# Patient Record
Sex: Female | Born: 1985 | Race: Asian | Hispanic: No | Marital: Married | Smoking: Current every day smoker
Health system: Southern US, Community
[De-identification: ages and names within clinical notes are randomized; demographics above are authoritative.]

## PROBLEM LIST (undated history)

## (undated) DIAGNOSIS — R51 Headache: Secondary | ICD-10-CM

## (undated) DIAGNOSIS — D649 Anemia, unspecified: Secondary | ICD-10-CM

## (undated) HISTORY — DX: Headache: R51

## (undated) HISTORY — DX: Anemia, unspecified: D64.9

## (undated) HISTORY — PX: NO PAST SURGERIES: SHX2092

---

## 2003-09-10 ENCOUNTER — Ambulatory Visit (HOSPITAL_COMMUNITY): Admission: RE | Admit: 2003-09-10 | Discharge: 2003-09-10 | Payer: Self-pay | Admitting: *Deleted

## 2003-10-16 ENCOUNTER — Ambulatory Visit (HOSPITAL_COMMUNITY): Admission: RE | Admit: 2003-10-16 | Discharge: 2003-10-16 | Payer: Self-pay | Admitting: *Deleted

## 2003-10-18 ENCOUNTER — Ambulatory Visit (HOSPITAL_COMMUNITY): Admission: RE | Admit: 2003-10-18 | Discharge: 2003-10-18 | Payer: Self-pay | Admitting: *Deleted

## 2004-02-24 ENCOUNTER — Observation Stay (HOSPITAL_COMMUNITY): Admission: AD | Admit: 2004-02-24 | Discharge: 2004-02-24 | Payer: Self-pay | Admitting: *Deleted

## 2004-03-12 ENCOUNTER — Encounter: Admission: RE | Admit: 2004-03-12 | Discharge: 2004-03-12 | Payer: Self-pay | Admitting: *Deleted

## 2004-03-14 ENCOUNTER — Inpatient Hospital Stay (HOSPITAL_COMMUNITY): Admission: AD | Admit: 2004-03-14 | Discharge: 2004-03-17 | Payer: Self-pay | Admitting: *Deleted

## 2004-03-15 ENCOUNTER — Encounter (INDEPENDENT_AMBULATORY_CARE_PROVIDER_SITE_OTHER): Payer: Self-pay | Admitting: Specialist

## 2010-09-15 ENCOUNTER — Other Ambulatory Visit
Admission: RE | Admit: 2010-09-15 | Discharge: 2010-09-15 | Payer: Self-pay | Source: Home / Self Care | Admitting: Gynecology

## 2010-09-15 ENCOUNTER — Ambulatory Visit
Admission: RE | Admit: 2010-09-15 | Discharge: 2010-09-15 | Payer: Self-pay | Source: Home / Self Care | Attending: Gynecology | Admitting: Gynecology

## 2010-09-24 ENCOUNTER — Ambulatory Visit
Admission: RE | Admit: 2010-09-24 | Discharge: 2010-09-24 | Payer: Self-pay | Source: Home / Self Care | Attending: Gynecology | Admitting: Gynecology

## 2011-06-08 ENCOUNTER — Ambulatory Visit: Payer: Self-pay | Admitting: Gynecology

## 2012-04-04 ENCOUNTER — Ambulatory Visit (INDEPENDENT_AMBULATORY_CARE_PROVIDER_SITE_OTHER): Payer: Managed Care, Other (non HMO) | Admitting: Internal Medicine

## 2012-04-04 VITALS — BP 104/72 | HR 71 | Temp 98.2°F | Resp 16 | Ht <= 58 in | Wt 112.0 lb

## 2012-04-04 DIAGNOSIS — B86 Scabies: Secondary | ICD-10-CM

## 2012-04-04 MED ORDER — PERMETHRIN 5 % EX CREA: TOPICAL_CREAM | Freq: Once | CUTANEOUS | Status: AC

## 2012-04-04 MED ORDER — HYDROXYZINE HCL 25 MG PO TABS: ORAL_TABLET | ORAL | Status: DC

## 2012-04-04 NOTE — Progress Notes (Signed)
  Subjective:    Patient ID: Shari Murray, female    DOB: [birth year], 26 y.o.   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  Lesions started on wrists and spread to torso and then to buttocks and thighs   Review of SystemsNo other illnesses or medications Is a current smoker 5-10 cigarettes per day     Objective:   Physical Exam No acute distress Vital signs normal Skin= small red papules that are crusted / also smooth and are very pruritic/No vesiculation Lesions that started on wrists have resolved/lesions on the torso appear have some burrows There are multiple lesions on the inner thighs and buttocks that are new  Scraped to lesions and under the microscope could see egg casings but no distinct mites or feces       Assessment & Plan:  Impression 1 scabies  Elimite/home hygiene Followup 10 days if not well

## 2012-04-04 NOTE — Patient Instructions (Addendum)
Scabies Scabies are small bugs (mites) that burrow under the skin and cause red bumps and severe itching. These bugs can only be seen with a microscope. Scabies are highly contagious. They can spread easily from person to person by direct contact. They are also spread through sharing clothing or linens that have the scabies mites living in them. It is not unusual for an entire family to become infected through shared towels, clothing, or bedding.  HOME CARE INSTRUCTIONS   Your caregiver may prescribe a cream or lotion to kill the mites. If this cream is prescribed; massage the cream into the entire area of the body from the neck to the bottom of both feet. Also massage the cream into the scalp and face if your child is less than 38 year old. Avoid the eyes and mouth.   Leave the cream on for 8 to12 hours. Do not wash your hands after application. Your child should bathe or shower after the 8 to 12 hour application period. Sometimes it is helpful to apply the cream to your child at right before bedtime.   One treatment is usually effective and will eliminate approximately 95% of infestations. For severe cases, your caregiver may decide to repeat the treatment in 1 week. Everyone in your household should be treated with one application of the cream.   New rashes or burrows should not appear after successful treatment within 24 to 48 hours; however the itching and rash may last for 2 to 4 weeks after successful treatment. If your symptoms persist longer than this, see your caregiver.   Your caregiver also may prescribe a medication to help with the itching or to help the rash go away more quickly.   Scabies can live on clothing or linens for up to 3 days. Your entire child's recently used clothing, towels, stuffed toys, and bed linens should be washed in hot water and then dried in a dryer for at least 20 minutes on high heat. Items that cannot be washed should be enclosed in a plastic bag for at least 3  days.   To help relieve itching, bathe your child in a cool bath or apply cool washcloths to the affected areas.   Your child may return to school after treatment with the prescribed cream.  SEEK MEDICAL CARE IF:   The itching persists longer than 4 weeks after treatment.   The rash spreads or becomes infected (the area has red blisters or yellow-tan crust).  Document Released: 08/30/2005 Document Revised: 08/19/2011 Document Reviewed: 01/08/2009 [facility] Patient Information 2012 [place], [place].   Your skin scraping revealed egg sacs consistent with the diagnosis of scabies    Ellamae Sia, M.D.

## 2012-04-28 ENCOUNTER — Ambulatory Visit (INDEPENDENT_AMBULATORY_CARE_PROVIDER_SITE_OTHER): Payer: Managed Care, Other (non HMO) | Admitting: Emergency Medicine

## 2012-04-28 VITALS — BP 100/68 | HR 78 | Temp 98.5°F | Resp 20 | Ht <= 58 in | Wt 111.0 lb

## 2012-04-28 DIAGNOSIS — L309 Dermatitis, unspecified: Secondary | ICD-10-CM

## 2012-04-28 DIAGNOSIS — L259 Unspecified contact dermatitis, unspecified cause: Secondary | ICD-10-CM

## 2012-04-28 MED ORDER — IVERMECTIN 3 MG PO TABS: 3.0000 mg | ORAL_TABLET | Freq: Once | ORAL | Status: DC

## 2012-04-28 MED ORDER — DESONIDE 0.05 % EX CREA: TOPICAL_CREAM | Freq: Two times a day (BID) | CUTANEOUS | Status: DC

## 2012-04-28 MED ORDER — METHYLPREDNISOLONE ACETATE 80 MG/ML IJ SUSP
120.0000 mg | Freq: Once | INTRAMUSCULAR | Status: AC
  Administered 2012-04-28: 120 mg via INTRAMUSCULAR

## 2012-04-28 NOTE — Progress Notes (Signed)
   Date:  04/28/2012   Name:  Shari Murray   DOB:  Aug 07, 1986   MRN:  [id number] Gender: female  Age: 26 y.o.  PCP:  No primary provider on file.    Chief Complaint: Rash   History of Present Illness:  Shari Murray is a 26 y.o. pleasant patient who presents with the following:  Exposed to scabies in a patient at work and developed clinically diagnosed scabies in July.  Treated here with permethrin as was her partner.  He never developed scabies.  She has continued to have a pruritic eruption on her thighs, behind her knees and on her abdomen.  Treating that with tea tree oil and otc hydrocortisone.  Denies any contact allergens or prior skin rashes.  Is concerned that she should be considered a workers comp case.  There is no problem list on file for this patient.   No past medical history on file.  No past surgical history on file.  History  Substance Use Topics  . Smoking status: Current Everyday Smoker -- 1.0 packs/day    Types: Cigarettes  . Smokeless tobacco: Not on file  . Alcohol Use: Not on file    No family history on file.  Allergies  Allergen Reactions  . Ibuprofen Itching    Medication list has been reviewed and updated.  Current Outpatient Prescriptions on File Prior to Visit  Medication Sig Dispense Refill  . hydrOXYzine (ATARAX/VISTARIL) 25 MG tablet 1-2 tabs at bedtime for itching  14 tablet  0   No current facility-administered medications on file prior to visit.    Review of Systems:  As per HPI, otherwise negative.    Physical Examination: Filed Vitals:   04/28/12 1736  BP: 100/68  Pulse: 78  Temp: 98.5 F (36.9 C)  Resp: 20   Filed Vitals:   04/28/12 1736  Height: 4\' 9"  (1.448 m)  Weight: 111 lb (50.349 kg)   Body mass index is 24.02 kg/(m^2). Ideal Body Weight: Weight in (lb) to have BMI = 25: 115.3    GEN: WDWN, NAD, Non-toxic, Alert & Oriented x 3 HEENT: Atraumatic, Normocephalic.  Ears and Nose: No external  deformity. EXTR: No clubbing/cyanosis/edema NEURO: Normal gait.  PSYCH: Normally interactive. Conversant. Not depressed or anxious appearing.  Calm demeanor.  Skin:  Erythematous coalescent excoriated patches on the back of right knee, inner thighs and anterior thighs.  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

## 2012-09-13 NOTE — L&D Delivery Note (Signed)
Delivery Note At 10:20 AM a viable female was delivered via  (Presentation:  Left Occiput Anterior).    Placenta status: Intact, Spontaneous.  Cord:  with the following complications: loose nuchal cord.    Anesthesia: Epidural  Episiotomy: None Lacerations: Labial, left Suture Repair: 3.0 vicryl rapide Est. Blood Loss (mL): 100 ml  Mom to postpartum.  Baby to nursery-stable.  JACKSON-MOORE,Yaneli Keithley A 04/09/2013, 10:48 AM

## 2012-09-21 ENCOUNTER — Ambulatory Visit (INDEPENDENT_AMBULATORY_CARE_PROVIDER_SITE_OTHER): Payer: Managed Care, Other (non HMO) | Admitting: Gynecology

## 2012-09-21 ENCOUNTER — Encounter: Payer: Self-pay | Admitting: Gynecology

## 2012-09-21 VITALS — BP 116/70 | Ht <= 58 in | Wt 117.0 lb

## 2012-09-21 DIAGNOSIS — N912 Amenorrhea, unspecified: Secondary | ICD-10-CM

## 2012-09-21 DIAGNOSIS — Z01419 Encounter for gynecological examination (general) (routine) without abnormal findings: Secondary | ICD-10-CM

## 2012-09-21 DIAGNOSIS — Z331 Pregnant state, incidental: Secondary | ICD-10-CM

## 2012-09-21 DIAGNOSIS — Z3490 Encounter for supervision of normal pregnancy, unspecified, unspecified trimester: Secondary | ICD-10-CM | POA: Insufficient documentation

## 2012-09-21 LAB — CBC WITH DIFFERENTIAL/PLATELET
Basophils Absolute: 0.1 10*3/uL (ref 0.0–0.1)
Basophils Relative: 0 % (ref 0–1)
Eosinophils Absolute: 0.3 10*3/uL (ref 0.0–0.7)
Eosinophils Relative: 2 % (ref 0–5)
HCT: 35.4 % — ABNORMAL LOW (ref 36.0–46.0)
Hemoglobin: 11.5 g/dL — ABNORMAL LOW (ref 12.0–15.0)
Lymphocytes Relative: 14 % (ref 12–46)
Lymphs Abs: 2.4 10*3/uL (ref 0.7–4.0)
MCH: 20.4 pg — ABNORMAL LOW (ref 26.0–34.0)
MCHC: 32.5 g/dL (ref 30.0–36.0)
MCV: 62.9 fL — ABNORMAL LOW (ref 78.0–100.0)
Monocytes Absolute: 1.1 10*3/uL — ABNORMAL HIGH (ref 0.1–1.0)
Monocytes Relative: 7 % (ref 3–12)
Neutro Abs: 13.4 10*3/uL — ABNORMAL HIGH (ref 1.7–7.7)
Neutrophils Relative %: 77 % (ref 43–77)
Platelets: 333 10*3/uL (ref 150–400)
RBC: 5.63 MIL/uL — ABNORMAL HIGH (ref 3.87–5.11)
RDW: 17.8 % — ABNORMAL HIGH (ref 11.5–15.5)
WBC: 17.3 10*3/uL — ABNORMAL HIGH (ref 4.0–10.5)

## 2012-09-21 LAB — PREGNANCY, URINE: Preg Test, Ur: POSITIVE

## 2012-09-21 NOTE — Patient Instructions (Addendum)

## 2012-09-21 NOTE — Progress Notes (Signed)
Shari Murray [birth year] [id number]   History:    27 y.o.  for annual gyn exam who stated that her last menstrual period was 07/22/2012. She's not been using any form of contraception. She had a home pregnancy test which was positive last week. Urine pregnancy test here in the office today positive. Patient denied any nausea or vomiting. Patient has had normal menstrual cycles in the past. Based on last menstrual period patient currently and a half weeks gestation with a tentative due date 04/29/2012. 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 Her last Pap smear was negative on January 2012. Patient frequently does her self breast examination. Patient denied any past history of any STDs or pelvic surgery. Her last pregnancy several years ago was delivered at term and vaginally.  Past medical history,surgical history, family history and social history were all reviewed and documented in the EPIC chart.  Gynecologic History Patient's last menstrual period was 07/22/2012. Contraception: none Last Pap: 2012. Results were: normal Last mammogram: Not indicated. Results were: Not indicated  Obstetric History OB History    Grav Para Term Preterm Abortions TAB SAB Ect Mult Living   1 1 1       1      # Outc Date GA Lbr Len/2nd Wgt Sex Del Anes PTL Lv   1 TRM     M SVD  No Yes       ROS: A ROS was performed and pertinent positives and negatives are included in the history.  GENERAL: No fevers or chills. HEENT: No change in vision, no earache, sore throat or sinus congestion. NECK: No pain or stiffness. CARDIOVASCULAR: No chest pain or pressure. No palpitations. PULMONARY: No shortness of breath, cough or wheeze. GASTROINTESTINAL: No abdominal pain, nausea, vomiting or diarrhea, melena or bright red blood per rectum. GENITOURINARY: No urinary frequency, urgency,  hesitancy or dysuria. MUSCULOSKELETAL: No joint or muscle pain, no back pain, no recent trauma. DERMATOLOGIC: No rash, no itching, no lesions. ENDOCRINE: No polyuria, polydipsia, no heat or cold intolerance. No recent change in weight. HEMATOLOGICAL: No anemia or easy bruising or bleeding. NEUROLOGIC: No headache, seizures, numbness, tingling or weakness. PSYCHIATRIC: No depression, no loss of interest in normal activity or change in sleep pattern.     Exam: chaperone present  BP 116/70  Ht 4\' 9"  (1.448 m)  Wt 117 lb (53.071 kg)  BMI 25.32 kg/m2  LMP 07/22/2012  Body mass index is 25.32 kg/(m^2).  General appearance : Well developed well nourished female. No acute distress HEENT: Neck supple, trachea midline, no carotid bruits, no thyroidmegaly Lungs: Clear to auscultation, no rhonchi or wheezes, or rib retractions  Heart: Regular rate and rhythm, no murmurs or gallops Breast:Examined in sitting and supine position were symmetrical in appearance, no palpable masses or tenderness,  no skin retraction, no nipple inversion, no nipple discharge, no skin discoloration, no axillary or supraclavicular lymphadenopathy Abdomen: no palpable masses or tenderness, no rebound or guarding Extremities: no edema or skin discoloration or tenderness  Pelvic:  Bartholin, Urethra, Skene Glands: Within normal limits             Vagina: No gross lesions or discharge  Cervix: No gross lesions or discharge  Uterus 8 week size nontender             Adnexa  Without masses or tenderness  Anus and perineum  normal  Rectovaginal  normal sphincter tone without palpated masses or tenderness             Hemoccult not indicated     Assessment/Plan:  27 y.o. female for annual exam who was approximately 8-1/[redacted] weeks pregnant. A quantitative beta-hCG will be drawn today. She will return back to the office next week for an ultrasound for confirmation of viability and correct dates. She will be started on prenatal  vitamins. Literature information on pregnancy was provided. No Pap smear done today. New guidelines discussed.    Ok Edwards MD, 12:32 PM 09/21/2012

## 2012-09-22 LAB — URINALYSIS W MICROSCOPIC + REFLEX CULTURE
Bacteria, UA: NONE SEEN
Bilirubin Urine: NEGATIVE
Casts: NONE SEEN
Crystals: NONE SEEN
Glucose, UA: NEGATIVE mg/dL
Hgb urine dipstick: NEGATIVE
Ketones, ur: NEGATIVE mg/dL
Leukocytes, UA: NEGATIVE
Nitrite: NEGATIVE
Protein, ur: NEGATIVE mg/dL
Specific Gravity, Urine: 1.018 (ref 1.005–1.030)
Urobilinogen, UA: 1 mg/dL (ref 0.0–1.0)
pH: 7.5 (ref 5.0–8.0)

## 2012-09-25 ENCOUNTER — Other Ambulatory Visit: Payer: Self-pay | Admitting: Gynecology

## 2012-09-25 DIAGNOSIS — D72829 Elevated white blood cell count, unspecified: Secondary | ICD-10-CM

## 2012-09-25 DIAGNOSIS — D649 Anemia, unspecified: Secondary | ICD-10-CM

## 2012-09-29 ENCOUNTER — Ambulatory Visit (INDEPENDENT_AMBULATORY_CARE_PROVIDER_SITE_OTHER): Payer: Managed Care, Other (non HMO) | Admitting: Gynecology

## 2012-09-29 ENCOUNTER — Encounter: Payer: Self-pay | Admitting: Gynecology

## 2012-09-29 ENCOUNTER — Ambulatory Visit (INDEPENDENT_AMBULATORY_CARE_PROVIDER_SITE_OTHER): Payer: Managed Care, Other (non HMO)

## 2012-09-29 DIAGNOSIS — N912 Amenorrhea, unspecified: Secondary | ICD-10-CM

## 2012-09-29 DIAGNOSIS — D649 Anemia, unspecified: Secondary | ICD-10-CM

## 2012-09-29 DIAGNOSIS — D72829 Elevated white blood cell count, unspecified: Secondary | ICD-10-CM

## 2012-09-29 DIAGNOSIS — Z331 Pregnant state, incidental: Secondary | ICD-10-CM

## 2012-09-29 DIAGNOSIS — Z3490 Encounter for supervision of normal pregnancy, unspecified, unspecified trimester: Secondary | ICD-10-CM

## 2012-09-29 LAB — US OB TRANSVAGINAL

## 2012-09-29 LAB — CBC WITH DIFFERENTIAL/PLATELET
Basophils Absolute: 0.1 10*3/uL (ref 0.0–0.1)
Basophils Relative: 0 % (ref 0–1)
Eosinophils Absolute: 0.4 10*3/uL (ref 0.0–0.7)
Eosinophils Relative: 2 % (ref 0–5)
HCT: 32.2 % — ABNORMAL LOW (ref 36.0–46.0)
Hemoglobin: 10.4 g/dL — ABNORMAL LOW (ref 12.0–15.0)
Lymphocytes Relative: 19 % (ref 12–46)
Lymphs Abs: 3.6 10*3/uL (ref 0.7–4.0)
MCH: 20.2 pg — ABNORMAL LOW (ref 26.0–34.0)
MCHC: 32.3 g/dL (ref 30.0–36.0)
MCV: 62.5 fL — ABNORMAL LOW (ref 78.0–100.0)
Monocytes Absolute: 1.1 10*3/uL — ABNORMAL HIGH (ref 0.1–1.0)
Monocytes Relative: 6 % (ref 3–12)
Neutro Abs: 13.5 10*3/uL — ABNORMAL HIGH (ref 1.7–7.7)
Neutrophils Relative %: 73 % (ref 43–77)
Platelets: 315 10*3/uL (ref 150–400)
RBC: 5.15 MIL/uL — ABNORMAL HIGH (ref 3.87–5.11)
RDW: 17.1 % — ABNORMAL HIGH (ref 11.5–15.5)
WBC: 18.7 10*3/uL — ABNORMAL HIGH (ref 4.0–10.5)

## 2012-09-29 NOTE — Progress Notes (Signed)
Patient is a 27 year old now gravida 1 para 0 who has stated her last menstrual period was 07/22/2012. She had not been using any form of contraception. She had a positive home pregnancy test and confirmed in the office on office visit on 09/21/2012. Patient reports normal menstrual cycles prior to that. She presented to the office today for an ultrasound to confirm fetal viability since based on her last menstrual period she is currently 9 weeks and 6 days with an estimated date of confinement 04/28/2013. Patient denies any past history of sexually transmitted diseases or any pelvic surgery and has otherwise been healthy.  Ultrasound today demonstrated an intrauterine gestational sac as well as a yolk sac was identified. A fetal pole was seen with cardiac activity noted. Both ovaries appeared to be normal and there was no fluid in the cul-de-sac.  On patient's last visit her CBC demonstrated a white count of 17.3  with a hemoglobin 11.5 hematocrit 35.4 and a normal platelet count 333,000. She 7 CBC repeated today and did informed me that she had some form of tooth problem last week.  Patient will be which referred to my colleagues at [facility] for her obstetrical care. She was reminded to take her prenatal vitamins.

## 2012-09-30 LAB — HCG, QUANTITATIVE, PREGNANCY: hCG, Beta Chain, Quant, S: 100325.4 m[IU]/mL

## 2012-10-13 ENCOUNTER — Telehealth: Payer: Self-pay

## 2012-10-13 NOTE — Telephone Encounter (Signed)
We had faxed labs to them and patient had called to schedule appointment.  Patient has a big deductible and could not afford to come for appointment. Was going to speak with husband about it and also some discussion regarding applying for Medicaid.  Patient has not called them back to schedule OB appointment and they just wanted to make you aware.

## 2012-10-16 ENCOUNTER — Telehealth: Payer: Self-pay

## 2012-10-16 NOTE — Telephone Encounter (Signed)
Patient called. She was able to get Medicaid.  She has Social worker but Medicaid secondary to pick up balance. She said [facility] does not accept Medicaid patients so she has not been able to schedule appointment with them.  She was calling to see if I knew any practice that accepted Medicare for OB pts.  I told her we are not provided that info and my suggestion would be to call around town to the different groups and inquire.  I did provide her with the GYN Clinic number at [facility] in case she cannot find a group and hopefully they can direct her to the [facility] clinic at [facility] or have other ideas. (I did not have a phone number for OB clinic.)  She will call back when she gets appointment set so we can fax records to her OB MD.

## 2012-11-01 LAB — OB RESULTS CONSOLE GC/CHLAMYDIA
Chlamydia: NEGATIVE
Gonorrhea: NEGATIVE

## 2012-11-29 ENCOUNTER — Other Ambulatory Visit: Payer: Self-pay | Admitting: Obstetrics

## 2012-11-29 DIAGNOSIS — Z369 Encounter for antenatal screening, unspecified: Secondary | ICD-10-CM

## 2012-12-10 ENCOUNTER — Encounter: Payer: Self-pay | Admitting: *Deleted

## 2012-12-13 ENCOUNTER — Other Ambulatory Visit: Payer: Managed Care, Other (non HMO)

## 2012-12-13 ENCOUNTER — Ambulatory Visit (INDEPENDENT_AMBULATORY_CARE_PROVIDER_SITE_OTHER): Payer: Managed Care, Other (non HMO)

## 2012-12-13 ENCOUNTER — Ambulatory Visit (INDEPENDENT_AMBULATORY_CARE_PROVIDER_SITE_OTHER): Payer: Managed Care, Other (non HMO) | Admitting: Obstetrics

## 2012-12-13 ENCOUNTER — Encounter: Payer: Managed Care, Other (non HMO) | Admitting: Obstetrics & Gynecology

## 2012-12-13 VITALS — BP 109/78 | Temp 98.0°F | Wt 113.0 lb

## 2012-12-13 DIAGNOSIS — Z3482 Encounter for supervision of other normal pregnancy, second trimester: Secondary | ICD-10-CM

## 2012-12-13 DIAGNOSIS — Z369 Encounter for antenatal screening, unspecified: Secondary | ICD-10-CM

## 2012-12-13 DIAGNOSIS — Z3480 Encounter for supervision of other normal pregnancy, unspecified trimester: Secondary | ICD-10-CM

## 2012-12-13 LAB — POCT URINALYSIS DIPSTICK
Bilirubin, UA: NEGATIVE
Blood, UA: NEGATIVE
Glucose, UA: NEGATIVE
Ketones, UA: NEGATIVE
Leukocytes, UA: NEGATIVE
Nitrite, UA: NEGATIVE
Protein, UA: NEGATIVE
Spec Grav, UA: <=1.005
Urobilinogen, UA: NEGATIVE
pH, UA: 8

## 2012-12-13 LAB — US OB DETAIL + 14 WK

## 2012-12-13 NOTE — Progress Notes (Signed)
Pulse- 87 Pt states that she has a lot of burning and irritation with discharge after intercourse.

## 2012-12-13 NOTE — Progress Notes (Signed)
No complaints. Doing well.

## 2012-12-19 ENCOUNTER — Encounter: Payer: Self-pay | Admitting: Obstetrics

## 2012-12-25 ENCOUNTER — Other Ambulatory Visit: Payer: Self-pay | Admitting: Obstetrics

## 2012-12-29 ENCOUNTER — Ambulatory Visit (HOSPITAL_COMMUNITY): Payer: Managed Care, Other (non HMO) | Attending: Obstetrics

## 2013-01-11 ENCOUNTER — Ambulatory Visit (INDEPENDENT_AMBULATORY_CARE_PROVIDER_SITE_OTHER): Payer: Managed Care, Other (non HMO) | Admitting: Obstetrics

## 2013-01-11 ENCOUNTER — Other Ambulatory Visit: Payer: Managed Care, Other (non HMO)

## 2013-01-11 ENCOUNTER — Encounter: Payer: Self-pay | Admitting: Obstetrics

## 2013-01-11 VITALS — BP 103/67 | Temp 98.7°F | Wt 118.0 lb

## 2013-01-11 DIAGNOSIS — Z3482 Encounter for supervision of other normal pregnancy, second trimester: Secondary | ICD-10-CM

## 2013-01-11 DIAGNOSIS — N760 Acute vaginitis: Secondary | ICD-10-CM | POA: Insufficient documentation

## 2013-01-11 DIAGNOSIS — Z3480 Encounter for supervision of other normal pregnancy, unspecified trimester: Secondary | ICD-10-CM

## 2013-01-11 LAB — POCT URINALYSIS DIPSTICK
Blood, UA: NEGATIVE
Glucose, UA: NEGATIVE
Ketones, UA: NEGATIVE
Nitrite, UA: NEGATIVE
Spec Grav, UA: <=1.005
pH, UA: 8

## 2013-01-11 MED ORDER — FLUCONAZOLE 150 MG PO TABS: 150.0000 mg | ORAL_TABLET | Freq: Once | ORAL | Status: DC

## 2013-01-11 NOTE — Addendum Note (Signed)
Addended by: Julaine Hua on: 01/11/2013 05:35 PM   Modules accepted: Orders

## 2013-01-11 NOTE — Progress Notes (Signed)
Pulse-98 Pt c/o vaginal burning and itching after sex four weeks. Pt c/o thick white vaginal discharge. Pt denies odor.

## 2013-01-12 LAB — OBSTETRIC PANEL
Antibody Screen: NEGATIVE
Basophils Absolute: 0.1 10*3/uL (ref 0.0–0.1)
Basophils Relative: 0 % (ref 0–1)
Eosinophils Absolute: 0.4 10*3/uL (ref 0.0–0.7)
Eosinophils Relative: 2 % (ref 0–5)
HCT: 30.4 % — ABNORMAL LOW (ref 36.0–46.0)
Hemoglobin: 9.6 g/dL — ABNORMAL LOW (ref 12.0–15.0)
Hepatitis B Surface Ag: NEGATIVE
Lymphocytes Relative: 13 % (ref 12–46)
Lymphs Abs: 2.6 10*3/uL (ref 0.7–4.0)
MCH: 20.3 pg — ABNORMAL LOW (ref 26.0–34.0)
MCHC: 31.6 g/dL (ref 30.0–36.0)
MCV: 64.4 fL — ABNORMAL LOW (ref 78.0–100.0)
Monocytes Absolute: 1 10*3/uL (ref 0.1–1.0)
Monocytes Relative: 5 % (ref 3–12)
Neutro Abs: 16.2 10*3/uL — ABNORMAL HIGH (ref 1.7–7.7)
Neutrophils Relative %: 80 % — ABNORMAL HIGH (ref 43–77)
Platelets: 348 10*3/uL (ref 150–400)
RBC: 4.72 MIL/uL (ref 3.87–5.11)
RDW: 19.2 % — ABNORMAL HIGH (ref 11.5–15.5)
Rh Type: POSITIVE
Rubella: 0.77 Index (ref <0.90)
WBC: 20.3 10*3/uL — ABNORMAL HIGH (ref 4.0–10.5)

## 2013-01-12 LAB — HIV ANTIBODY (ROUTINE TESTING W REFLEX): HIV: NONREACTIVE

## 2013-01-12 LAB — GLUCOSE TOLERANCE, 2 HOURS W/ 1HR
Glucose, 1 hour: 103 mg/dL (ref 70–170)
Glucose, 2 hour: 84 mg/dL (ref 70–139)
Glucose, Fasting: 39 mg/dL — CL (ref 70–99)

## 2013-01-12 LAB — WET PREP BY MOLECULAR PROBE
Candida species: POSITIVE — AB
Gardnerella vaginalis: POSITIVE — AB
Trichomonas vaginosis: NEGATIVE

## 2013-01-12 LAB — VITAMIN D 25 HYDROXY (VIT D DEFICIENCY, FRACTURES): Vit D, 25-Hydroxy: 14 ng/mL — ABNORMAL LOW (ref 30–89)

## 2013-01-12 LAB — VARICELLA ZOSTER ANTIBODY, IGG: Varicella IgG: 619.9 Index — ABNORMAL HIGH (ref <135.00)

## 2013-01-13 LAB — CULTURE, OB URINE
Colony Count: NO GROWTH
Organism ID, Bacteria: NO GROWTH

## 2013-01-15 LAB — HEMOGLOBINOPATHY EVALUATION
Hemoglobin Other: 93.1 % — ABNORMAL HIGH
Hgb A2 Quant: 4.7 % — ABNORMAL HIGH (ref 2.2–3.2)
Hgb A: 0 % — ABNORMAL LOW (ref 96.8–97.8)
Hgb F Quant: 2.2 % — ABNORMAL HIGH (ref 0.0–2.0)
Hgb S Quant: 0 %

## 2013-01-16 ENCOUNTER — Other Ambulatory Visit: Payer: Self-pay | Admitting: *Deleted

## 2013-01-16 DIAGNOSIS — B9689 Other specified bacterial agents as the cause of diseases classified elsewhere: Secondary | ICD-10-CM

## 2013-01-16 DIAGNOSIS — B373 Candidiasis of vulva and vagina: Secondary | ICD-10-CM

## 2013-01-16 DIAGNOSIS — N760 Acute vaginitis: Secondary | ICD-10-CM

## 2013-01-16 MED ORDER — FLUCONAZOLE 150 MG PO TABS: ORAL_TABLET | ORAL | Status: DC

## 2013-01-16 MED ORDER — TINIDAZOLE 500 MG PO TABS: 1.0000 g | ORAL_TABLET | Freq: Every day | ORAL | Status: AC

## 2013-01-16 NOTE — Progress Notes (Signed)
Pt aware of results and prescription sent to pt's pharmacy. Pt expressed understanding.   

## 2013-01-19 LAB — HGB ELECTROPHORESIS REFLEXED REPORT
Hemoglobin A - HGBRFX: 0 % — ABNORMAL LOW (ref >96.0)
Hemoglobin F - HGBRFX: 1.8 % (ref <2.0)

## 2013-01-23 ENCOUNTER — Other Ambulatory Visit: Payer: Self-pay | Admitting: Obstetrics

## 2013-01-24 ENCOUNTER — Encounter: Payer: Self-pay | Admitting: Obstetrics

## 2013-01-25 ENCOUNTER — Telehealth: Payer: Self-pay | Admitting: Hematology & Oncology

## 2013-01-25 NOTE — Telephone Encounter (Signed)
Left pt message to call and schedule appointment °

## 2013-01-26 ENCOUNTER — Telehealth: Payer: Self-pay | Admitting: Hematology & Oncology

## 2013-01-26 NOTE — Telephone Encounter (Signed)
Left message on both home and cell for pt to call and schedule appointment

## 2013-01-30 ENCOUNTER — Ambulatory Visit (INDEPENDENT_AMBULATORY_CARE_PROVIDER_SITE_OTHER): Payer: Managed Care, Other (non HMO) | Admitting: Obstetrics

## 2013-01-30 ENCOUNTER — Telehealth: Payer: Self-pay | Admitting: Hematology & Oncology

## 2013-01-30 VITALS — BP 110/72 | Temp 98.2°F | Wt 119.2 lb

## 2013-01-30 DIAGNOSIS — Z3480 Encounter for supervision of other normal pregnancy, unspecified trimester: Secondary | ICD-10-CM

## 2013-01-30 DIAGNOSIS — Z3483 Encounter for supervision of other normal pregnancy, third trimester: Secondary | ICD-10-CM

## 2013-01-30 DIAGNOSIS — Z3482 Encounter for supervision of other normal pregnancy, second trimester: Secondary | ICD-10-CM

## 2013-01-30 LAB — POCT URINALYSIS DIPSTICK
Bilirubin, UA: NEGATIVE
Glucose, UA: NEGATIVE
Ketones, UA: NEGATIVE
Leukocytes, UA: NEGATIVE
Protein, UA: NEGATIVE
Spec Grav, UA: <=1.005

## 2013-01-30 NOTE — Progress Notes (Signed)
Pulse: 102

## 2013-01-30 NOTE — Telephone Encounter (Signed)
Scarlette Calico from referring called said pt was coming in today at 3 pm and wanted to give her the appointment time. She is aware of 6-6 appointment and that pt may have to wait 45 minutes between lab and MD appointment

## 2013-01-31 ENCOUNTER — Encounter: Payer: Self-pay | Admitting: Obstetrics

## 2013-02-02 ENCOUNTER — Ambulatory Visit (HOSPITAL_COMMUNITY)
Admission: RE | Admit: 2013-02-02 | Discharge: 2013-02-02 | Disposition: A | Discharge status: Home / Self Care | Payer: Managed Care, Other (non HMO) | Source: Ambulatory Visit | Attending: Obstetrics | Admitting: Obstetrics

## 2013-02-08 NOTE — Progress Notes (Signed)
Genetic Counseling  High-Risk Gestation Note  Appointment Date:  02/02/2013 Referred By: Brock Bad, MD Date of Birth:  1986/04/14    Pregnancy History: G2P1001 Estimated Date of Delivery: 04/28/13 Estimated Gestational Age: [redacted]w[redacted]d Attending: Eulis Foster, MD   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 We reviewed both family histories, and the couple's son reportedly has hemoglobin E trait. Additionally, the patient reported that her sister and a niece also have hemoglobin E trait. [name] reported that her husband has Caucasian ancestry and no known family history of hemoglobinopathies or hemoglobin variants.  The family histories are noncontributory for birth defects, mental retardation, and known genetic conditions. Without further information regarding the provided family history, an accurate genetic risk cannot be calculated. Further genetic counseling is warranted if more information is obtained.  We discussed that hemoglobin is the oxygen-carrying pigment of red blood cells.  The type of hemoglobin we have is determined by inheritance.  There are different genes that make hemoglobin and its subunits; examples include alpha globin genes and beta globin genes. Hemoglobin E and beta thalassemia are caused due to changes in the beta globin gene. It is not uncommon for a mistake to exist in a hemoglobin gene which may produce a hemoglobin variant. We reviewed the autosomal recessive inheritance of hemoglobinopathies. A pregnancy would have a 1 in 4 (25%) chance to inherit a hemoglobinopathy, such as thalassemia, when both parents are carriers of a  hemoglobin variant affecting the same hemoglobin chain (such as both parents carrying beta globin variants). All offspring of an individual who is homozygous for hemoglobin variants would be obligate carriers.   We specifically discussed with the patient that hemoglobin EE disease is generally benign. However, hemoglobin E inherited with beta thalassemia trait may have clinical symptoms similar to beta thalassemia major, associated with severe anemia and splenomegaly. [name] reported that she has had anemia throughout her lifetime but has not had major medical issues or symptoms of thalassemia that she is aware of. [name] hemoglobin electrophoresis reported a Hb F level of 1.8%. For individuals with Hemoglobin E/Beta thalassemia, HbF is usually in excess of 5%.  We discussed that based on available information, she is more likely to be homozygous for hemoglobin E. However, this cannot be determined definitively at this time. [name] has an upcoming appointment with hematology to further clarify her status and discuss medical management for herself.   We reviewed the autosomal recessive inheritance of hemoglobinopathies and discussed that the risk to inherit a hemoglobinopathy for the pregnancy would depend upon the carrier status of the father of the pregnancy. We reviewed that all of Shari Murray offspring will be obligate carriers of hemoglobin E (if she is Hemoglobin EE), and if she is hemoglobin E/beta thalassemia, each offspring would carry either Hb E or beta thalassemia. We reviewed that the carrier frequency of hemoglobin variants and thalassemia is lower in the Caucasian population. If the father of the pregnancy carries a hemoglobin variant, risk for inheritance of a hemoglobinopathy for the pregnancy would be 50%. If he does not carry a hemoglobin variant, then all of their offspring together would be carriers for a hemoglobin variant but not affected.  We  reviewed that hemoglobin E is a beta globin variant. However, there are reports of hemoglobin E (beta globin variant) trait and co-inheritance of alpha thalassemia traits. Individuals who are heterozygous for Hb E and for alpha-thalassemia trait of one deletion (-a/aa) or two deletions (--/aa) reportedly have normal clinical features. Whereas co-inheritance of Hb E and Hemoglobin H (--/-a) results in intermediate thalassemia called HbAE Bart's disease. Thus, carrier screening for the father of the pregnancy, if desired, would be most informative if performed for beta and alpha globin chain variants. We discussed the screening is available for the father of the pregnancy via hemoglobin electrophoresis, complete blood count, and ferritin studies, if desired. The patient may contact our office if they would like [place] to facilitate this screening.   We discussed that prenatal ultrasound would not be expected to detect features of a hemoglobinopathy in most cases, aside from hemoglobin Barts disease, for which this pregnancy is not expected to be at risk. Amniocentesis would be available for an at risk pregnancy for prenatal diagnosis of hemoglobinopathy. The risks, benefits, and limitations of amniocentesis were reviewed. Shari Murray indicated that she would not be interested in amniocentesis, even if the pregnancy were determined to be at risk for a hemoglobinopathy. We discussed that hemoglobinopathies are routinely screened for as part of the [place] newborn screening panel.    [name] denied exposure to environmental toxins or chemical agents. She denied the use of alcohol, tobacco or street drugs. She denied significant viral illnesses during the course of her pregnancy. Her medical and surgical histories were otherwise noncontributory.   I counseled [name] regarding the above risks and available options.  The approximate face-to-face time with the genetic counselor was 35  minutes.  Quinn Plowman, MS,  Certified Genetic Counselor 02/08/2013

## 2013-02-13 ENCOUNTER — Ambulatory Visit (INDEPENDENT_AMBULATORY_CARE_PROVIDER_SITE_OTHER): Payer: Managed Care, Other (non HMO) | Admitting: Obstetrics

## 2013-02-13 VITALS — BP 96/61 | Temp 97.9°F | Wt 119.0 lb

## 2013-02-13 DIAGNOSIS — Z3480 Encounter for supervision of other normal pregnancy, unspecified trimester: Secondary | ICD-10-CM

## 2013-02-13 LAB — POCT URINALYSIS DIPSTICK
Bilirubin, UA: NEGATIVE
Glucose, UA: NEGATIVE
Nitrite, UA: NEGATIVE
pH, UA: 7

## 2013-02-13 NOTE — Progress Notes (Signed)
Pulse-95 No complaints. 

## 2013-02-14 ENCOUNTER — Encounter: Payer: Self-pay | Admitting: Obstetrics

## 2013-02-16 ENCOUNTER — Other Ambulatory Visit (HOSPITAL_BASED_OUTPATIENT_CLINIC_OR_DEPARTMENT_OTHER): Payer: BC Managed Care – PPO | Admitting: Lab

## 2013-02-16 ENCOUNTER — Ambulatory Visit (HOSPITAL_BASED_OUTPATIENT_CLINIC_OR_DEPARTMENT_OTHER): Payer: BC Managed Care – PPO | Admitting: Hematology & Oncology

## 2013-02-16 ENCOUNTER — Ambulatory Visit: Payer: Managed Care, Other (non HMO)

## 2013-02-16 VITALS — BP 167/83 | HR 91 | Temp 98.1°F | Resp 16 | Ht <= 58 in | Wt 121.0 lb

## 2013-02-16 DIAGNOSIS — D565 Hemoglobin E-beta thalassemia: Secondary | ICD-10-CM

## 2013-02-16 DIAGNOSIS — F17200 Nicotine dependence, unspecified, uncomplicated: Secondary | ICD-10-CM

## 2013-02-16 DIAGNOSIS — D72829 Elevated white blood cell count, unspecified: Secondary | ICD-10-CM

## 2013-02-16 LAB — CBC WITH DIFFERENTIAL (CANCER CENTER ONLY)
BASO#: 0.1 10*3/uL (ref 0.0–0.2)
BASO%: 0.2 % (ref 0.0–2.0)
EOS%: 1.1 % (ref 0.0–7.0)
HGB: 9.8 g/dL — ABNORMAL LOW (ref 11.6–15.9)
LYMPH#: 3.3 10*3/uL (ref 0.9–3.3)
MCHC: 34.6 g/dL (ref 32.0–36.0)
MONO#: 2.1 10*3/uL — ABNORMAL HIGH (ref 0.1–0.9)
NEUT#: 17 10*3/uL — ABNORMAL HIGH (ref 1.5–6.5)
WBC: 22.7 10*3/uL — ABNORMAL HIGH (ref 3.9–10.0)

## 2013-02-16 MED ORDER — FOLIC ACID 1 MG PO TABS: 1.0000 mg | ORAL_TABLET | Freq: Every day | ORAL | Status: DC

## 2013-02-16 NOTE — Progress Notes (Signed)
This office note has been dictated.

## 2013-02-17 NOTE — Progress Notes (Signed)
CC:   Shari Murray, M.D.  DIAGNOSES: 1. Hemoglobin E. 2. Third-trimester pregnancy.  HISTORY OF PRESENT ILLNESS:  Shari Murray is a very charming 27 year old [place] woman.  She and her family have been in the [place] for about 19 years.  She is married for the past couple of years.  She does have a 54-year-old son.  She had no problems with this pregnancy.  She is now followed by Dr. Coral Ceo.  He started seeing her during the pregnancy.  She is going to be due on 04/28/2013.  He did do some lab work on her.  He did do a hemoglobin electrophoresis on her.  This showed that she had a hemoglobin E.  There was a tiny amount of hemoglobin A2 (4.5%).  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  He felt that a hematologic evaluation was indicated because of the abnormal hemoglobin.  She has been checked for hepatitis.  This has been negative.  She has been checked for HIV.  This has been negative.  Lab work going back to, I think, January showed a hemoglobin 11.5, hematocrit 35.1.  MCV was 63.  Her platelet count was 333,000.  In early May, a CBC was done which showed a white cell count of 20.3, hemoglobin 9.6, hematocrit 30.4, platelet count 348,000.  MCV was 64.  Shari Murray does smoke.  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  Also noted was that she had a marked hypoglycemia when tested back in May.  She says she probably smoked about a pack per day before her pregnancy. She is still smoking a few cigarettes a day.  She is on prenatal vitamins right now.  Again, she has never had any transfusions.  She has never had any type of issues with "anemia."  There has been no bony pain.  She has had no hematuria.  There has been no cough or shortness of breath.  The patient says she does feel tired, which she said she gets from her pregnancy.  PAST MEDICAL HISTORY:  Pretty much  unremarkable.  ALLERGIES:  Ibuprofen and shellfish.  MEDICATIONS: 1. Prenatal vitamins daily. 2. Tylenol daily.  SOCIAL HISTORY:  Remarkable for tobacco use.  She states that she does have 1-pack-per-day use.  She is going to try to cut out smoking with her pregnancy and after she delivers.  There is no significant alcohol use.  She has no obvious occupational exposures.  FAMILY HISTORY:  Remarkable for anemia.  REVIEW OF SYSTEMS:  As stated in History of Present Illness.  No additional findings are noted on 12-system review.  PHYSICAL EXAMINATION:  General:  This is a very petite [place] female in no obvious distress.  Vital Signs:  Show a temperature of 98.1, pulse 61, respiratory rate 16, blood pressure 96/59.  Weight is 121.  Head and neck:  Normocephalic, atraumatic skull.  There are no ocular or oral lesions.  She has no scleral icterus.  She does have some residual tonsillar tissue.  There is no adenopathy in her neck.  Thyroid is nonpalpable.  Lungs:  Clear bilaterally.  Cardiac:  Regular rate and rhythm, with a normal S1 and S2.  There are no murmurs, rubs or bruits. Abdomen:  Soft, with good bowel sounds.  There is no palpable abdominal mass.  There is no fluid wave.  There is no palpable hepatomegaly.  She is obviously pregnant.  I cannot feel her spleen  tip.  Extremities: Show no clubbing, cyanosis or edema.  She has good range motion of her joints.  Skin:  No rashes, ecchymoses or petechia.  Neurological:  Shows no focal neurological deficit.  LABORATORY STUDIES:  White cell count is 22.7, hemoglobin 9.8, hematocrit 28.3, platelet count is 324,000.  MCV is 62.  White cell differential shows 75 segs, 15 lymphs, 9 monos.  Peripheral smear shows a microcytic population of red blood cells.  She has marked target cells.  I do not see any inclusion bodies.  There may be a rare schistocyte.  There are a couple of spherocytes.  There are no nucleated red blood cells.   I see no rouleaux formation.  White cells are increased in number.  White cells appear reactive.  There are no blasts.  There are no atypical lymphocytes or immature myeloid cells. There are no hypersegmented polys.  Platelets are adequate in number and size.  IMPRESSION:  Shari Murray is a 27 year old [place] woman with hemoglobin E.  This is quite common in [place].  Probably about 25-30% of the population has hemoglobin E.  For the most part, hemoglobin E a benign disorder.  This has been proven to be the case so far with Shari Murray.  She got through her first pregnancy without any difficulties.  Her baby, when he was born, was 8 pounds.  This pregnancy she is having a girl.  So far, everything is going along okay.  I think Shari Murray biggest problem is going to be smoking.  I think this would be the biggest reason for her to have any issues with her blood.  With smoking, the carboxyhemoglobin that is formed can denature her hemoglobin E and could lead to hemolysis.  I do believe that she needs to be on folic acid.  I realize that there is some folic acid in her prenatal vitamins.  I believe that she needs to be on a higher dose of folic acid.  I gave her a prescription for 1 mg a day of folic acid.  Since she had no problems with her 1st pregnancy, I could not imagine her having a problem with this pregnancy.  I am not sure as to why she has the leukocytosis.  Her blood smear is unremarkable.  It could be reactive.  It could reflect some kind of underlying infection.  However, I do not see that the leukocytosis is reflective of anything with respect to a bone marrow issue or her hemoglobinopathy.  I had a great time talking with Shari Murray and her husband.  They are both a very cute couple.  I want to see her back in about 2 months.  I want to make sure that she is doing okay before she delivers and that her hemoglobin is optimized as much as  possible.    ______________________________ Josph Macho, M.D. PRE/MEDQ  D:  02/16/2013  T:  02/17/2013  Job:  [id number]

## 2013-02-19 ENCOUNTER — Telehealth: Payer: Self-pay | Admitting: Hematology & Oncology

## 2013-02-19 NOTE — Telephone Encounter (Signed)
Mailed 7-30 schedule

## 2013-02-20 LAB — HEMOGLOBINOPATHY EVALUATION
Hgb A2 Quant: 4.6 % — ABNORMAL HIGH (ref 2.2–3.2)
Hgb A: 0 % — ABNORMAL LOW (ref 96.8–97.8)
Hgb F Quant: 1.8 % (ref 0.0–2.0)
Hgb S Quant: 0 %

## 2013-02-20 LAB — IRON AND TIBC: %SAT: 14 % — ABNORMAL LOW (ref 20–55)

## 2013-02-20 LAB — FERRITIN: Ferritin: 22 ng/mL (ref 10–291)

## 2013-02-26 ENCOUNTER — Telehealth: Payer: Self-pay | Admitting: Hematology & Oncology

## 2013-02-26 NOTE — Telephone Encounter (Addendum)
Message copied by Cathi Roan on Mon Feb 26, 2013  2:34 PM ------      Message from: Arlan Organ R      Created: Mon Feb 26, 2013  7:32 AM       Call - iron is low.  Need to take some OTC iron supplement in addition to pre-natal vitamins.  Cindee Lame  02-26-13 Called patient at home and left detailed message on machine regarding above MD message, advised if any questions to call this office. S.Trynosky LPN ------

## 2013-02-27 ENCOUNTER — Encounter: Payer: Self-pay | Admitting: Obstetrics

## 2013-02-27 ENCOUNTER — Ambulatory Visit (INDEPENDENT_AMBULATORY_CARE_PROVIDER_SITE_OTHER): Payer: BC Managed Care – PPO | Admitting: Obstetrics

## 2013-02-27 VITALS — BP 100/63 | Temp 98.0°F | Wt 124.0 lb

## 2013-02-27 DIAGNOSIS — Z3483 Encounter for supervision of other normal pregnancy, third trimester: Secondary | ICD-10-CM

## 2013-02-27 DIAGNOSIS — Z3480 Encounter for supervision of other normal pregnancy, unspecified trimester: Secondary | ICD-10-CM

## 2013-02-27 LAB — POCT URINALYSIS DIPSTICK
Blood, UA: NEGATIVE
Ketones, UA: NEGATIVE
Protein, UA: NEGATIVE
Spec Grav, UA: 1.01
Urobilinogen, UA: NEGATIVE
pH, UA: 7

## 2013-02-27 NOTE — Progress Notes (Signed)
Pulse- 101  Pt states she is having a thick discharge. Pt is having trouble holding her urine.

## 2013-03-13 ENCOUNTER — Encounter: Payer: Self-pay | Admitting: Obstetrics

## 2013-03-13 ENCOUNTER — Ambulatory Visit (INDEPENDENT_AMBULATORY_CARE_PROVIDER_SITE_OTHER): Payer: BC Managed Care – PPO | Admitting: Obstetrics

## 2013-03-13 ENCOUNTER — Ambulatory Visit (INDEPENDENT_AMBULATORY_CARE_PROVIDER_SITE_OTHER): Payer: BC Managed Care – PPO

## 2013-03-13 VITALS — BP 98/66 | Temp 98.5°F | Wt 124.0 lb

## 2013-03-13 DIAGNOSIS — O365933 Maternal care for other known or suspected poor fetal growth, third trimester, fetus 3: Secondary | ICD-10-CM

## 2013-03-13 DIAGNOSIS — Z3480 Encounter for supervision of other normal pregnancy, unspecified trimester: Secondary | ICD-10-CM

## 2013-03-13 DIAGNOSIS — O365990 Maternal care for other known or suspected poor fetal growth, unspecified trimester, not applicable or unspecified: Secondary | ICD-10-CM

## 2013-03-13 DIAGNOSIS — Z3483 Encounter for supervision of other normal pregnancy, third trimester: Secondary | ICD-10-CM

## 2013-03-13 LAB — US OB DETAIL + 14 WK

## 2013-03-13 LAB — POCT URINALYSIS DIPSTICK
Bilirubin, UA: NEGATIVE
Blood, UA: NEGATIVE
Ketones, UA: NEGATIVE
Spec Grav, UA: 1.015
pH, UA: 7

## 2013-03-13 NOTE — Progress Notes (Signed)
Pulse 92, patient has no concerns today

## 2013-03-15 ENCOUNTER — Encounter: Payer: Self-pay | Admitting: Obstetrics

## 2013-03-15 LAB — US OB DETAIL + 14 WK

## 2013-03-26 ENCOUNTER — Encounter: Payer: BC Managed Care – PPO | Admitting: Obstetrics

## 2013-03-26 ENCOUNTER — Encounter: Payer: Self-pay | Admitting: Obstetrics

## 2013-03-26 ENCOUNTER — Ambulatory Visit (INDEPENDENT_AMBULATORY_CARE_PROVIDER_SITE_OTHER): Payer: BC Managed Care – PPO | Admitting: Obstetrics

## 2013-03-26 VITALS — BP 110/66 | Temp 98.5°F | Wt 126.0 lb

## 2013-03-26 DIAGNOSIS — Z3483 Encounter for supervision of other normal pregnancy, third trimester: Secondary | ICD-10-CM

## 2013-03-26 DIAGNOSIS — Z3480 Encounter for supervision of other normal pregnancy, unspecified trimester: Secondary | ICD-10-CM

## 2013-03-26 LAB — POCT URINALYSIS DIPSTICK
Blood, UA: NEGATIVE
Glucose, UA: NEGATIVE
Nitrite, UA: NEGATIVE
Spec Grav, UA: 1.015
Urobilinogen, UA: NEGATIVE

## 2013-03-26 NOTE — Progress Notes (Signed)
Pulse-96 No complaints.

## 2013-03-28 LAB — STREP B DNA PROBE: GBSP: NEGATIVE

## 2013-03-29 ENCOUNTER — Encounter: Payer: Self-pay | Admitting: Obstetrics & Gynecology

## 2013-03-29 LAB — US OB DETAIL + 14 WK

## 2013-04-02 ENCOUNTER — Ambulatory Visit (INDEPENDENT_AMBULATORY_CARE_PROVIDER_SITE_OTHER): Payer: BC Managed Care – PPO | Admitting: Obstetrics

## 2013-04-02 VITALS — BP 111/76 | Temp 98.1°F | Wt 127.6 lb

## 2013-04-02 DIAGNOSIS — Z3483 Encounter for supervision of other normal pregnancy, third trimester: Secondary | ICD-10-CM

## 2013-04-02 DIAGNOSIS — Z3480 Encounter for supervision of other normal pregnancy, unspecified trimester: Secondary | ICD-10-CM

## 2013-04-02 LAB — POCT URINALYSIS DIPSTICK
Blood, UA: NEGATIVE
Glucose, UA: NEGATIVE
Ketones, UA: NEGATIVE
Spec Grav, UA: <=1.005

## 2013-04-02 NOTE — Progress Notes (Signed)
Pulse: 84

## 2013-04-03 ENCOUNTER — Encounter: Payer: Self-pay | Admitting: Obstetrics

## 2013-04-05 ENCOUNTER — Encounter: Payer: Self-pay | Admitting: Obstetrics

## 2013-04-08 ENCOUNTER — Inpatient Hospital Stay (HOSPITAL_COMMUNITY)
Admission: AD | Admit: 2013-04-08 | Discharge: 2013-04-11 | DRG: 373 | Disposition: A | Discharge status: Home / Self Care | Payer: BC Managed Care – PPO | Source: Ambulatory Visit | Attending: Obstetrics | Admitting: Obstetrics

## 2013-04-08 ENCOUNTER — Encounter (HOSPITAL_COMMUNITY): Payer: Self-pay | Admitting: *Deleted

## 2013-04-08 LAB — CBC
HCT: 28 % — ABNORMAL LOW (ref 36.0–46.0)
Hemoglobin: 9.6 g/dL — ABNORMAL LOW (ref 12.0–15.0)
MCV: 61.5 fL — ABNORMAL LOW (ref 78.0–100.0)
RDW: 17.3 % — ABNORMAL HIGH (ref 11.5–15.5)
WBC: 21.4 10*3/uL — ABNORMAL HIGH (ref 4.0–10.5)

## 2013-04-08 LAB — TYPE AND SCREEN: Antibody Screen: NEGATIVE

## 2013-04-08 MED ORDER — OXYTOCIN 40 UNITS IN LACTATED RINGERS INFUSION - SIMPLE MED: 62.5000 mL/h | INTRAVENOUS | Status: DC

## 2013-04-08 MED ORDER — OXYCODONE-ACETAMINOPHEN 5-325 MG PO TABS: 1.0000 | ORAL_TABLET | ORAL | Status: DC | PRN

## 2013-04-08 MED ORDER — OXYTOCIN 40 UNITS IN LACTATED RINGERS INFUSION - SIMPLE MED
1.0000 m[IU]/min | INTRAVENOUS | Status: DC
  Administered 2013-04-08: 2 m[IU]/min via INTRAVENOUS
  Filled 2013-04-08: qty 1000

## 2013-04-08 MED ORDER — BUTORPHANOL TARTRATE 1 MG/ML IJ SOLN
1.0000 mg | INTRAMUSCULAR | Status: DC | PRN
  Administered 2013-04-08: 1 mg via INTRAVENOUS

## 2013-04-08 MED ORDER — LACTATED RINGERS IV SOLN: 500.0000 mL | Freq: Once | INTRAVENOUS | Status: DC

## 2013-04-08 MED ORDER — FLEET ENEMA 7-19 GM/118ML RE ENEM: 1.0000 | ENEMA | RECTAL | Status: DC | PRN

## 2013-04-08 MED ORDER — OXYTOCIN BOLUS FROM INFUSION
500.0000 mL | INTRAVENOUS | Status: DC
  Administered 2013-04-09: 500 mL via INTRAVENOUS

## 2013-04-08 MED ORDER — ACETAMINOPHEN 325 MG PO TABS: 650.0000 mg | ORAL_TABLET | ORAL | Status: DC | PRN

## 2013-04-08 MED ORDER — LACTATED RINGERS IV SOLN: 500.0000 mL | INTRAVENOUS | Status: DC | PRN

## 2013-04-08 MED ORDER — PHENYLEPHRINE 40 MCG/ML (10ML) SYRINGE FOR IV PUSH (FOR BLOOD PRESSURE SUPPORT): 80.0000 ug | PREFILLED_SYRINGE | INTRAVENOUS | Status: DC | PRN

## 2013-04-08 MED ORDER — EPHEDRINE 5 MG/ML INJ
10.0000 mg | INTRAVENOUS | Status: DC | PRN
  Filled 2013-04-08: qty 4

## 2013-04-08 MED ORDER — LACTATED RINGERS IV SOLN
INTRAVENOUS | Status: DC
  Administered 2013-04-08 – 2013-04-09 (×3): via INTRAVENOUS

## 2013-04-08 MED ORDER — TERBUTALINE SULFATE 1 MG/ML IJ SOLN: 0.2500 mg | Freq: Once | INTRAMUSCULAR | Status: AC | PRN

## 2013-04-08 MED ORDER — CITRIC ACID-SODIUM CITRATE 334-500 MG/5ML PO SOLN: 30.0000 mL | ORAL | Status: DC | PRN

## 2013-04-08 MED ORDER — LIDOCAINE HCL (PF) 1 % IJ SOLN
30.0000 mL | INTRAMUSCULAR | Status: AC | PRN
  Administered 2013-04-09: 30 mL via SUBCUTANEOUS
  Filled 2013-04-08 (×2): qty 30

## 2013-04-08 MED ORDER — FENTANYL 2.5 MCG/ML BUPIVACAINE 1/10 % EPIDURAL INFUSION (WH - ANES)
14.0000 mL/h | INTRAMUSCULAR | Status: DC | PRN
  Administered 2013-04-09 (×2): 14 mL/h via EPIDURAL
  Filled 2013-04-08: qty 125

## 2013-04-08 MED ORDER — EPHEDRINE 5 MG/ML INJ: 10.0000 mg | INTRAVENOUS | Status: DC | PRN

## 2013-04-08 MED ORDER — PHENYLEPHRINE 40 MCG/ML (10ML) SYRINGE FOR IV PUSH (FOR BLOOD PRESSURE SUPPORT)
80.0000 ug | PREFILLED_SYRINGE | INTRAVENOUS | Status: DC | PRN
  Filled 2013-04-08: qty 5

## 2013-04-08 MED ORDER — ONDANSETRON HCL 4 MG/2ML IJ SOLN: 4.0000 mg | Freq: Four times a day (QID) | INTRAMUSCULAR | Status: DC | PRN

## 2013-04-08 MED ORDER — DIPHENHYDRAMINE HCL 50 MG/ML IJ SOLN: 12.5000 mg | INTRAMUSCULAR | Status: DC | PRN

## 2013-04-08 MED ORDER — BUTORPHANOL TARTRATE 1 MG/ML IJ SOLN
1.0000 mg | INTRAMUSCULAR | Status: DC | PRN
  Filled 2013-04-08: qty 1

## 2013-04-08 NOTE — MAU Note (Signed)
Pt states contdrations are closer and harder

## 2013-04-09 ENCOUNTER — Encounter: Payer: BC Managed Care – PPO | Admitting: Obstetrics

## 2013-04-09 ENCOUNTER — Encounter (HOSPITAL_COMMUNITY): Payer: Self-pay | Admitting: *Deleted

## 2013-04-09 ENCOUNTER — Inpatient Hospital Stay (HOSPITAL_COMMUNITY): Payer: BC Managed Care – PPO | Admitting: Anesthesiology

## 2013-04-09 ENCOUNTER — Encounter (HOSPITAL_COMMUNITY): Payer: Self-pay | Admitting: Anesthesiology

## 2013-04-09 MED ORDER — LANOLIN HYDROUS EX OINT: TOPICAL_OINTMENT | CUTANEOUS | Status: DC | PRN

## 2013-04-09 MED ORDER — FERROUS SULFATE 325 (65 FE) MG PO TABS
325.0000 mg | ORAL_TABLET | Freq: Two times a day (BID) | ORAL | Status: DC
  Administered 2013-04-09 – 2013-04-11 (×4): 325 mg via ORAL
  Filled 2013-04-09 (×4): qty 1

## 2013-04-09 MED ORDER — DIPHENHYDRAMINE HCL 25 MG PO CAPS: 25.0000 mg | ORAL_CAPSULE | Freq: Four times a day (QID) | ORAL | Status: DC | PRN

## 2013-04-09 MED ORDER — BENZOCAINE-MENTHOL 20-0.5 % EX AERO
1.0000 "application " | INHALATION_SPRAY | CUTANEOUS | Status: DC | PRN
  Filled 2013-04-09: qty 56

## 2013-04-09 MED ORDER — ACETAMINOPHEN 500 MG PO TABS
1000.0000 mg | ORAL_TABLET | Freq: Four times a day (QID) | ORAL | Status: DC | PRN
  Administered 2013-04-09 – 2013-04-11 (×4): 1000 mg via ORAL
  Filled 2013-04-09 (×4): qty 2

## 2013-04-09 MED ORDER — OXYCODONE-ACETAMINOPHEN 5-325 MG PO TABS: 1.0000 | ORAL_TABLET | ORAL | Status: DC | PRN

## 2013-04-09 MED ORDER — ONDANSETRON HCL 4 MG PO TABS: 4.0000 mg | ORAL_TABLET | ORAL | Status: DC | PRN

## 2013-04-09 MED ORDER — LIDOCAINE HCL (PF) 1 % IJ SOLN
INTRAMUSCULAR | Status: DC | PRN
  Administered 2013-04-09 (×2): 5 mL

## 2013-04-09 MED ORDER — ZOLPIDEM TARTRATE 5 MG PO TABS: 5.0000 mg | ORAL_TABLET | Freq: Every evening | ORAL | Status: DC | PRN

## 2013-04-09 MED ORDER — DIBUCAINE 1 % RE OINT: 1.0000 "application " | TOPICAL_OINTMENT | RECTAL | Status: DC | PRN

## 2013-04-09 MED ORDER — WITCH HAZEL-GLYCERIN EX PADS: 1.0000 "application " | MEDICATED_PAD | CUTANEOUS | Status: DC | PRN

## 2013-04-09 MED ORDER — SENNOSIDES-DOCUSATE SODIUM 8.6-50 MG PO TABS
2.0000 | ORAL_TABLET | Freq: Every day | ORAL | Status: DC
  Administered 2013-04-09 – 2013-04-10 (×2): 2 via ORAL

## 2013-04-09 MED ORDER — PRENATAL MULTIVITAMIN CH
1.0000 | ORAL_TABLET | Freq: Every day | ORAL | Status: DC
  Administered 2013-04-10: 1 via ORAL
  Filled 2013-04-09 (×2): qty 1

## 2013-04-09 MED ORDER — TRAMADOL HCL 50 MG PO TABS
100.0000 mg | ORAL_TABLET | Freq: Four times a day (QID) | ORAL | Status: DC | PRN
  Administered 2013-04-09 – 2013-04-11 (×4): 100 mg via ORAL
  Filled 2013-04-09: qty 2
  Filled 2013-04-09 (×2): qty 1
  Filled 2013-04-09 (×2): qty 2

## 2013-04-09 MED ORDER — MEASLES, MUMPS & RUBELLA VAC ~~LOC~~ INJ
0.5000 mL | INJECTION | Freq: Once | SUBCUTANEOUS | Status: AC
  Administered 2013-04-11: 0.5 mL via SUBCUTANEOUS
  Filled 2013-04-09 (×2): qty 0.5

## 2013-04-09 MED ORDER — ONDANSETRON HCL 4 MG/2ML IJ SOLN: 4.0000 mg | INTRAMUSCULAR | Status: DC | PRN

## 2013-04-09 MED ORDER — NICOTINE 14 MG/24HR TD PT24
14.0000 mg | MEDICATED_PATCH | Freq: Every day | TRANSDERMAL | Status: DC
  Filled 2013-04-09 (×3): qty 1

## 2013-04-09 MED ORDER — TETANUS-DIPHTH-ACELL PERTUSSIS 5-2.5-18.5 LF-MCG/0.5 IM SUSP: 0.5000 mL | Freq: Once | INTRAMUSCULAR | Status: DC

## 2013-04-09 MED ORDER — IBUPROFEN 600 MG PO TABS: 600.0000 mg | ORAL_TABLET | Freq: Four times a day (QID) | ORAL | Status: DC

## 2013-04-09 MED ORDER — MAGNESIUM HYDROXIDE 400 MG/5ML PO SUSP: 30.0000 mL | ORAL | Status: DC | PRN

## 2013-04-09 NOTE — Progress Notes (Signed)
This note also relates to the following rows which could not be included: Dose (milli-units/min) Oxytocin - Cannot attach notes to extension rows Order received from Dr. Gaynell Face to turn off pitocin. Informed that he will be in the OR for several hours and unavailable if needed for delivery.

## 2013-04-09 NOTE — H&P (Signed)
This is Dr. Francoise Ceo dictating the history and physical on  [place] [facility] she's a 27 year old gravida 2 para 101 at 37 weeks and 2 days EDC 04/28/2013 GBS negative patient's membranes ruptured yesterday morning fluid clear and eventually transferred to labor and delivery she's now on Pitocin and her cervix is 5 cm vertex -2 Past medical history negative Past surgical history negative Social history noncontributory System review negative Physical exam well-developed female in labor HEENT negative Lungs clear to P&A Heart regular rhythm no murmurs no gallops Breasts negative Abdomen term Pelvic as described above Extremities negative and

## 2013-04-09 NOTE — Anesthesia Postprocedure Evaluation (Signed)
  Anesthesia Post-op Note  Patient: Shari Murray  Procedure(s) Performed: * No procedures listed *  Patient Location: Mother/Baby  Anesthesia Type:Epidural  Level of Consciousness: awake, alert  and oriented  Airway and Oxygen Therapy: Patient Spontanous Breathing  Post-op Pain: mild  Post-op Assessment: Post-op Vital signs reviewed, Patient's Cardiovascular Status Stable, No headache, No backache, No residual numbness and No residual motor weakness  Post-op Vital Signs: Reviewed and stable  Complications: No apparent anesthesia complications

## 2013-04-09 NOTE — Anesthesia Procedure Notes (Signed)
Epidural Patient location during procedure: OB Start time: 04/09/2013 1:53 AM  Staffing Anesthesiologist: Angus Seller., Harrell Gave. Performed by: anesthesiologist   Preanesthetic Checklist Completed: patient identified, site marked, surgical consent, pre-op evaluation, timeout performed, IV checked, risks and benefits discussed and monitors and equipment checked  Epidural Patient position: sitting Prep: ChloraPrep and site prepped and draped Patient monitoring: continuous pulse ox and blood pressure Approach: midline Injection technique: LOR air and LOR saline  Needle:  Needle type: Tuohy  Needle gauge: 17 G Needle length: 9 cm and 9 Needle insertion depth: 5 cm cm Catheter type: closed end flexible Catheter size: 19 Gauge Catheter at skin depth: 10 cm Test dose: negative  Assessment Events: blood not aspirated, injection not painful, no injection resistance, negative IV test and no paresthesia  Additional Notes Patient identified.  Risk benefits discussed including failed block, incomplete pain control, headache, nerve damage, paralysis, blood pressure changes, nausea, vomiting, reactions to medication both toxic or allergic, and postpartum back pain.  Patient expressed understanding and wished to proceed.  All questions were answered.  Sterile technique used throughout procedure and epidural site dressed with sterile barrier dressing. No paresthesia or other complications noted.The patient did not experience any signs of intravascular injection such as tinnitus or metallic taste in mouth nor signs of intrathecal spread such as rapid motor block. Please see nursing notes for vital signs.

## 2013-04-09 NOTE — Anesthesia Preprocedure Evaluation (Signed)
Anesthesia Evaluation  Patient identified by MRN, date of birth, ID band Patient awake    Reviewed: Allergy & Precautions, H&P , Patient's Chart, lab work & pertinent test results  Airway Mallampati: II TM Distance: >3 FB Neck ROM: full    Dental no notable dental hx.    Pulmonary neg pulmonary ROS,  breath sounds clear to auscultation  Pulmonary exam normal       Cardiovascular negative cardio ROS  Rhythm:regular Rate:Normal     Neuro/Psych negative neurological ROS  negative psych ROS   GI/Hepatic negative GI ROS, Neg liver ROS,   Endo/Other  negative endocrine ROS  Renal/GU negative Renal ROS     Musculoskeletal   Abdominal   Peds  Hematology negative hematology ROS (+) anemia ,   Anesthesia Other Findings   Reproductive/Obstetrics (+) Pregnancy                           Anesthesia Physical Anesthesia Plan  ASA: II  Anesthesia Plan: Epidural   Post-op Pain Management:    Induction:   Airway Management Planned:   Additional Equipment:   Intra-op Plan:   Post-operative Plan:   Informed Consent: I have reviewed the patients History and Physical, chart, labs and discussed the procedure including the risks, benefits and alternatives for the proposed anesthesia with the patient or authorized representative who has indicated his/her understanding and acceptance.     Plan Discussed with:   Anesthesia Plan Comments:         Anesthesia Quick Evaluation

## 2013-04-10 LAB — CBC
HCT: 27.8 % — ABNORMAL LOW (ref 36.0–46.0)
MCH: 21.3 pg — ABNORMAL LOW (ref 26.0–34.0)
MCHC: 34.5 g/dL (ref 30.0–36.0)
MCV: 61.6 fL — ABNORMAL LOW (ref 78.0–100.0)
RDW: 17.2 % — ABNORMAL HIGH (ref 11.5–15.5)
WBC: 28.2 10*3/uL — ABNORMAL HIGH (ref 4.0–10.5)

## 2013-04-10 NOTE — Progress Notes (Signed)
Post Partum Day 1 Subjective: no complaints  Objective: Blood pressure 107/70, pulse 71, temperature 97.7 F (36.5 C), temperature source Oral, resp. rate 18, height 4\' 9"  (1.448 m), weight 127 lb (57.607 kg), last menstrual period 07/22/2012, SpO2 97.00%, unknown if currently breastfeeding.  Physical Exam:  General: alert and no distress Lochia: appropriate Uterine Fundus: firm Incision: none DVT Evaluation: No evidence of DVT seen on physical exam.   Recent Labs  04/08/13 0810 04/10/13 0550  HGB 9.6* 9.6*  HCT 28.0* 27.8*    Assessment/Plan: Plan for discharge tomorrow   LOS: 2 days   Shari Murray A 04/10/2013, 9:53 AM

## 2013-04-11 MED ORDER — TRAMADOL HCL 50 MG PO TABS: 100.0000 mg | ORAL_TABLET | Freq: Four times a day (QID) | ORAL | Status: DC | PRN

## 2013-04-11 NOTE — Progress Notes (Signed)
Post Partum Day 2 Subjective: no complaints  Objective: Blood pressure 106/71, pulse 74, temperature 98.3 F (36.8 C), temperature source Oral, resp. rate 18, height 4\' 9"  (1.448 m), weight 127 lb (57.607 kg), last menstrual period 07/22/2012, SpO2 97.00%, unknown if currently breastfeeding.  Physical Exam:  General: alert and no distress Lochia: appropriate Uterine Fundus: firm Incision: none DVT Evaluation: No evidence of DVT seen on physical exam.   Recent Labs  04/08/13 0810 04/10/13 0550  HGB 9.6* 9.6*  HCT 28.0* 27.8*    Assessment/Plan: Discharge home   LOS: 3 days   HARPER,CHARLES A 04/11/2013, 7:52 AM

## 2013-04-11 NOTE — Discharge Summary (Signed)
Obstetric Discharge Summary Reason for Admission: onset of labor Prenatal Procedures: ultrasound Intrapartum Procedures: spontaneous vaginal delivery Postpartum Procedures: none Complications-Operative and Postpartum: none HGB  Date Value Range Status  02/16/2013 9.8* 11.6 - 15.9 g/dL Final     Hemoglobin  Date Value Range Status  04/10/2013 9.6* 12.0 - 15.0 g/dL Final     HCT  Date Value Range Status  04/10/2013 27.8* 36.0 - 46.0 % Final  02/16/2013 28.3* 34.8 - 46.6 % Final    Physical Exam:  General: alert and no distress Lochia: appropriate Uterine Fundus: firm Incision: none DVT Evaluation: No evidence of DVT seen on physical exam.  Discharge Diagnoses: Term Pregnancy-delivered  Discharge Information: Date: 04/11/2013 Activity: pelvic rest Diet: routine Medications: PNV, Ibuprofen, Colace, Iron and Percocet Condition: stable Instructions: refer to practice specific booklet Discharge to: home Follow-up Information   Follow up with HARPER,CHARLES A, MD. Schedule an appointment as soon as possible for a visit in 2 weeks.   Contact information:   [address] [place] [address] [phone]       Newborn Data: Live born female  Birth Weight: 5 lb 8.7 oz (2515 g) APGAR: 8, 9  Home with mother.  HARPER,CHARLES A 04/11/2013, 8:01 AM

## 2013-04-12 ENCOUNTER — Ambulatory Visit: Payer: BC Managed Care – PPO | Admitting: Hematology & Oncology

## 2013-04-12 ENCOUNTER — Telehealth: Payer: Self-pay | Admitting: Hematology & Oncology

## 2013-04-12 ENCOUNTER — Other Ambulatory Visit: Payer: BC Managed Care – PPO | Admitting: Lab

## 2013-04-12 NOTE — Telephone Encounter (Signed)
Pt had baby cx 7-31. Per RN Amy pt may need to be seen once more she will talk to MD and let me know if I need to schedule

## 2013-07-03 ENCOUNTER — Ambulatory Visit (INDEPENDENT_AMBULATORY_CARE_PROVIDER_SITE_OTHER): Payer: BC Managed Care – PPO | Admitting: Obstetrics

## 2013-07-03 DIAGNOSIS — Z3009 Encounter for other general counseling and advice on contraception: Secondary | ICD-10-CM

## 2013-07-03 MED ORDER — NORETHINDRONE 0.35 MG PO TABS: 1.0000 | ORAL_TABLET | Freq: Every day | ORAL | Status: DC

## 2013-07-03 NOTE — Progress Notes (Signed)
Subjective:     Shari Murray is a 27 y.o. female who presents for a postpartum visit. She is 2 months postpartum following a spontaneous vaginal delivery. I have fully reviewed the prenatal and intrapartum course. The delivery was at 37.2 gestational weeks. Outcome: spontaneous vaginal delivery. Anesthesia: epidural. Postpartum course has been normal. Baby's course has been normal. Baby is feeding by breast. Bleeding no bleeding. Bowel function is normal. Bladder function is normal. Patient is sexually active. Contraception method is condoms. Postpartum depression screening: negative.  The following portions of the patient's history were reviewed and updated as appropriate: allergies, current medications, past family history, past medical history, past social history, past surgical history and problem list.  Review of Systems Pertinent items are noted in HPI.   Objective:    BP 104/69  Pulse 86  Temp(Src) 98.9 F (37.2 C)  Wt 106 lb (48.081 kg)  BMI 22.93 kg/m2  Breastfeeding? Yes  General:  alert and no distressL   Breasts:  inspection negative, no nipple discharge or bleeding, no masses or nodularity palpable  PE;    Breasts:  No masses or tenderness. Abdomen:  Soft, NT Uterus:  NSSC, NT.                                 Assessment:     Normal postpartum exam. Pap smear not done at today's visit.    Counseling done for contraceptive initiation.  Breast feeding.   Plan:    1. Contraception: oral progesterone-only contraceptive   2. All questions answered. 3. Follow up in: several months or as needed.

## 2014-01-14 ENCOUNTER — Ambulatory Visit: Payer: Self-pay | Admitting: Family Medicine

## 2014-01-14 VITALS — BP 126/76 | HR 96 | Temp 100.3°F | Resp 16 | Ht <= 58 in | Wt 115.3 lb

## 2014-01-14 DIAGNOSIS — R509 Fever, unspecified: Secondary | ICD-10-CM

## 2014-01-14 DIAGNOSIS — D72829 Elevated white blood cell count, unspecified: Secondary | ICD-10-CM

## 2014-01-14 DIAGNOSIS — J029 Acute pharyngitis, unspecified: Secondary | ICD-10-CM

## 2014-01-14 LAB — POCT URINALYSIS DIPSTICK
Bilirubin, UA: NEGATIVE
Blood, UA: NEGATIVE
Glucose, UA: NEGATIVE
Ketones, UA: NEGATIVE
Leukocytes, UA: NEGATIVE
Nitrite, UA: NEGATIVE
Protein, UA: NEGATIVE
Spec Grav, UA: 1.015
Urobilinogen, UA: 1
pH, UA: 7.5

## 2014-01-14 LAB — POCT CBC
GRANULOCYTE PERCENT: 86.2 % — AB (ref 37–80)
HEMATOCRIT: 35.5 % — AB (ref 37.7–47.9)
HEMOGLOBIN: 11.8 g/dL — AB (ref 12.2–16.2)
LYMPH, POC: 2 (ref 0.6–3.4)
MCH, POC: 20.5 pg — AB (ref 27–31.2)
MCHC: 33.2 g/dL (ref 31.8–35.4)
MCV: 61.6 fL — AB (ref 80–97)
MID (cbc): 1 — AB (ref 0–0.9)
MPV: 10.2 fL (ref 0–99.8)
POC Granulocyte: 19.1 — AB (ref 2–6.9)
POC LYMPH %: 9.2 % — AB (ref 10–50)
POC MID %: 4.6 %M (ref 0–12)
Platelet Count, POC: 325 10*3/uL (ref 142–424)
RBC: 5.77 M/uL — AB (ref 4.04–5.48)
RDW, POC: 16 %
WBC: 22.1 10*3/uL — AB (ref 4.6–10.2)

## 2014-01-14 LAB — POCT RAPID STREP A (OFFICE): RAPID STREP A SCREEN: NEGATIVE

## 2014-01-14 LAB — POCT UA - MICROSCOPIC ONLY
Bacteria, U Microscopic: NEGATIVE
Casts, Ur, LPF, POC: NEGATIVE
Crystals, Ur, HPF, POC: NEGATIVE
Mucus, UA: NEGATIVE
RBC, urine, microscopic: NEGATIVE
Yeast, UA: NEGATIVE

## 2014-01-14 MED ORDER — ACETAMINOPHEN 325 MG PO TABS: 1000.0000 mg | ORAL_TABLET | Freq: Once | ORAL | Status: DC

## 2014-01-14 MED ORDER — PENICILLIN V POTASSIUM 500 MG PO TABS: 500.0000 mg | ORAL_TABLET | Freq: Two times a day (BID) | ORAL | Status: DC

## 2014-01-14 NOTE — Patient Instructions (Signed)
Use the penicillin antibiotic as directed.  Let me know if you do not feel better in the next 24- 36 hours- Sooner if worse.   Please come by for a lab visit only in about 1 week to recheck your blood count.

## 2014-01-14 NOTE — Progress Notes (Addendum)
[facility] [address] [place] [address] [phone]  Date:  01/14/2014   Name:  Shari Murray   DOB:  1986-03-02   MRN:  [id number]  PCP:  Shelly Bombard, MD    Chief Complaint: Generalized Body Aches and Sore Throat   History of Present Illness:  Shari Murray is a 28 y.o. very pleasant female patient who presents with the following:  She is here today with 3 days of fever, ST and swollen glands.   Her infant daughter is ill as well- she is 18 months old and is on amoxicillin for OM. She is acutally not lactating right now- she has weaned her baby.   She has noted just an occasional cough.   No earache. No GI symptoms.      LMP was 4/22  She is generally healthy She took some advil this am but no tylenol as of yet  Patient Active Problem List   Diagnosis Date Noted  . Normal delivery 04/09/2013  . Poor fetal growth, affecting management of mother, antepartum condition or complication 45/36/4680  . Vaginitis and vulvovaginitis, unspecified 01/11/2013  . Pregnancy 09/21/2012    Past Medical History  Diagnosis Date  . Anemia   . HOZYYQMG(500.3)     Past Surgical History  Procedure Laterality Date  . No past surgeries      History  Substance Use Topics  . Smoking status: Current Every Day Smoker -- 0.50 packs/day    Types: Cigarettes    Start date: 12/10/1993  . Smokeless tobacco: Never Used  . Alcohol Use: No    Family History  Problem Relation Age of Onset  . Hypertension Father     Allergies  Allergen Reactions  . Ibuprofen Itching  . Shellfish Allergy Hives and Itching    Medication list has been reviewed and updated.  Current Outpatient Prescriptions on File Prior to Visit  Medication Sig Dispense Refill  . folic acid (FOLVITE) 1 MG tablet Take 1 tablet (1 mg total) by mouth daily.  30 tablet  12  . norethindrone (MICRONOR,CAMILA,ERRIN) 0.35 MG tablet Take 1 tablet (0.35 mg total) by mouth daily.  1  Package  11  . Prenatal Vit-Fe Fumarate-FA (PRENATAL MULTIVITAMIN) TABS Take 1 tablet by mouth daily at 12 noon.      . traMADol (ULTRAM) 50 MG tablet Take 2 tablets (100 mg total) by mouth every 6 (six) hours as needed.  40 tablet  2   No current facility-administered medications on file prior to visit.    Review of Systems:  As per HPI- otherwise negative.   Physical Examination: Filed Vitals:   01/14/14 1313  BP: 126/76  Pulse: 122  Temp: 103 F (39.4 C)  Resp: 16   Filed Vitals:   01/14/14 1313  Height: 4' 9.5" (1.461 m)  Weight: 115 lb 5.1 oz (52.309 kg)   Body mass index is 24.51 kg/(m^2). Ideal Body Weight: Weight in (lb) to have BMI = 25: 117.3  GEN: WDWN, NAD, Non-toxic, A & O x 3, looks well HEENT: Atraumatic, Normocephalic. Neck supple. No masses, No LAD.  Bilateral TM wnl, oropharynx shows mild exudate.  PEERL,EOMI.   Ears and Nose: No external deformity. CV: RRR, No M/G/R. No JVD. No thrill. No extra heart sounds. PULM: CTA B, no wheezes, crackles, rhonchi. No retractions. No resp. distress. No accessory muscle use. ABD: S, NT, ND, +BS. No rebound. No HSM. EXTR: No c/c/e NEURO Normal gait.  PSYCH:  Normally interactive. Conversant. Not depressed or anxious appearing.  Calm demeanor.   Given tylenol ES #2 in clinic.    Results for orders placed in visit on 01/14/14  POCT RAPID STREP A (OFFICE)      Result Value Ref Range   Rapid Strep A Screen Negative  Negative  POCT CBC      Result Value Ref Range   WBC 22.1 (*) 4.6 - 10.2 K/uL   Lymph, poc 2.0  0.6 - 3.4   POC LYMPH PERCENT 9.2 (*) 10 - 50 %L   MID (cbc) 1.0 (*) 0 - 0.9   POC MID % 4.6  0 - 12 %M   POC Granulocyte 19.1 (*) 2 - 6.9   Granulocyte percent 86.2 (*) 37 - 80 %G   RBC 5.77 (*) 4.04 - 5.48 M/uL   Hemoglobin 11.8 (*) 12.2 - 16.2 g/dL   HCT, POC 35.5 (*) 37.7 - 47.9 %   MCV 61.6 (*) 80 - 97 fL   MCH, POC 20.5 (*) 27 - 31.2 pg   MCHC 33.2  31.8 - 35.4 g/dL   RDW, POC 16.0     Platelet  Count, POC 325  142 - 424 K/uL   MPV 10.2  0 - 99.8 fL  POCT UA - MICROSCOPIC ONLY      Result Value Ref Range   WBC, Ur, HPF, POC 0-1     RBC, urine, microscopic neg     Bacteria, U Microscopic neg     Mucus, UA neg     Epithelial cells, urine per micros 0-6     Crystals, Ur, HPF, POC neg     Casts, Ur, LPF, POC neg     Yeast, UA neg    POCT URINALYSIS DIPSTICK      Result Value Ref Range   Color, UA yellow     Clarity, UA clear     Glucose, UA neg     Bilirubin, UA neg     Ketones, UA neg     Spec Grav, UA 1.015     Blood, UA neg     pH, UA 7.5     Protein, UA neg     Urobilinogen, UA 1.0     Nitrite, UA neg     Leukocytes, UA Negative      Assessment and Plan: Fever, unspecified - Plan: acetaminophen (TYLENOL) tablet 975 mg, POCT rapid strep A, Culture, Group A Strep, POCT CBC, POCT UA - Microscopic Only, POCT urinalysis dipstick, penicillin v potassium (VEETID) 500 MG tablet  Acute pharyngitis - Plan: POCT rapid strep A, Culture, Group A Strep, penicillin v potassium (VEETID) 500 MG tablet  Leukocytosis, unspecified - Plan: CBC treat for febrile pharyngitis with penicillin.  Suspect she may have strep.  Await throat culture.  Continue antipyretics as needed.   Repeat CBC in one week See patient instructions for more details.     Meds ordered this encounter  Medications  . acetaminophen (TYLENOL) tablet 975 mg    Sig:   . penicillin v potassium (VEETID) 500 MG tablet    Sig: Take 1 tablet (500 mg total) by mouth 2 (two) times daily.    Dispense:  20 tablet    Refill:  0   Signed Lamar Blinks, MD  5/7: called and LMOM. I received throat culture, she has strep non- group A strep per throat culture.  Let me know if not better, and remember to repeat CBC in one week

## 2014-01-17 LAB — CULTURE, GROUP A STREP

## 2014-07-31 ENCOUNTER — Ambulatory Visit (INDEPENDENT_AMBULATORY_CARE_PROVIDER_SITE_OTHER): Payer: 59 | Admitting: Emergency Medicine

## 2014-07-31 VITALS — BP 112/70 | HR 78 | Temp 98.1°F | Resp 16 | Ht <= 58 in | Wt 122.0 lb

## 2014-07-31 DIAGNOSIS — J209 Acute bronchitis, unspecified: Secondary | ICD-10-CM

## 2014-07-31 DIAGNOSIS — J0100 Acute maxillary sinusitis, unspecified: Secondary | ICD-10-CM

## 2014-07-31 MED ORDER — AMOXICILLIN-POT CLAVULANATE 875-125 MG PO TABS: 1.0000 | ORAL_TABLET | Freq: Two times a day (BID) | ORAL | Status: DC

## 2014-07-31 MED ORDER — PROMETHAZINE-CODEINE 6.25-10 MG/5ML PO SYRP: 5.0000 mL | ORAL_SOLUTION | ORAL | Status: DC | PRN

## 2014-07-31 MED ORDER — PSEUDOEPHEDRINE-GUAIFENESIN ER 60-600 MG PO TB12: 1.0000 | ORAL_TABLET | Freq: Two times a day (BID) | ORAL | Status: AC

## 2014-07-31 NOTE — Progress Notes (Signed)
[facility] [address] [place] [address] [phone]  Date:  07/31/2014   Name:  Shari Murray   DOB:  1986/05/02   MRN:  [id number]  PCP:  Shelly Bombard, MD    Chief Complaint: Cough   History of Present Illness:  Shari Murray is a 28 y.o. very pleasant female patient who presents with the following:  Ill since Thursday with cough and congestion.  Nasal congestion with a purulent nasal drainage and post nasal drip.   Has a cough productive scant sputum.  No wheezing or shortness of breath.  Cough worse at night.   No fever but chilled No nausea or vomiting or stool change.  No rash Eating normally No improvement with over the counter medications or other home remedies.  Denies other complaint or health concern today.   Patient Active Problem List   Diagnosis Date Noted  . Normal delivery 04/09/2013  . Poor fetal growth, affecting management of mother, antepartum condition or complication 19/50/9326  . Vaginitis and vulvovaginitis, unspecified 01/11/2013  . Pregnancy 09/21/2012    Past Medical History  Diagnosis Date  . Anemia   . ZTIWPYKD(983.3)     Past Surgical History  Procedure Laterality Date  . No past surgeries      History  Substance Use Topics  . Smoking status: Current Every Day Smoker -- 0.50 packs/day    Types: Cigarettes    Start date: 12/10/1993  . Smokeless tobacco: Never Used  . Alcohol Use: No    Family History  Problem Relation Age of Onset  . Hypertension Father     Allergies  Allergen Reactions  . Ibuprofen Itching    Able to take advil brand ok  . Shellfish Allergy Hives and Itching    Medication list has been reviewed and updated.  Current Outpatient Prescriptions on File Prior to Visit  Medication Sig Dispense Refill  . folic acid (FOLVITE) 1 MG tablet Take 1 tablet (1 mg total) by mouth daily. 30 tablet 12  . norethindrone (MICRONOR,CAMILA,ERRIN) 0.35 MG tablet Take 1 tablet  (0.35 mg total) by mouth daily. 1 Package 11  . penicillin v potassium (VEETID) 500 MG tablet Take 1 tablet (500 mg total) by mouth 2 (two) times daily. 20 tablet 0  . Prenatal Vit-Fe Fumarate-FA (PRENATAL MULTIVITAMIN) TABS Take 1 tablet by mouth daily at 12 noon.    . traMADol (ULTRAM) 50 MG tablet Take 2 tablets (100 mg total) by mouth every 6 (six) hours as needed. 40 tablet 2   Current Facility-Administered Medications on File Prior to Visit  Medication Dose Route Frequency Provider Last Rate Last Dose  . acetaminophen (TYLENOL) tablet 975 mg  975 mg Oral Once Darreld Mclean, MD        Review of Systems:   As per HPI, otherwise negative.    Physical Examination: Filed Vitals:   07/31/14 1443  BP: 112/70  Pulse: 78  Temp: 98.1 F (36.7 C)  Resp: 16   Filed Vitals:   07/31/14 1443  Height: 4' 9.5" (1.461 m)  Weight: 122 lb (55.339 kg)   Body mass index is 25.93 kg/(m^2). Ideal Body Weight: Weight in (lb) to have BMI = 25: 117.3  GEN: WDWN, NAD, Non-toxic, A & O x 3 HEENT: Atraumatic, Normocephalic. Neck supple. No masses, No LAD. Ears and Nose: No external deformity. CV: RRR, No M/G/R. No JVD. No thrill. No extra heart sounds. PULM: CTA B, no wheezes, crackles, rhonchi.  No retractions. No resp. distress. No accessory muscle use. ABD: S, NT, ND, +BS. No rebound. No HSM. EXTR: No c/c/e NEURO Normal gait.  PSYCH: Normally interactive. Conversant. Not depressed or anxious appearing.  Calm demeanor.    Assessment and Plan: Sinusitis Bronchitis augmentin mucinex  Phen c cod  Signed,  Ellison Carwin, MD

## 2014-07-31 NOTE — Patient Instructions (Signed)

## 2014-08-12 ENCOUNTER — Ambulatory Visit (INDEPENDENT_AMBULATORY_CARE_PROVIDER_SITE_OTHER): Payer: 59 | Admitting: Family Medicine

## 2014-08-12 VITALS — BP 114/66 | HR 67 | Temp 98.2°F | Resp 16 | Ht <= 58 in | Wt 121.4 lb

## 2014-08-12 DIAGNOSIS — N898 Other specified noninflammatory disorders of vagina: Secondary | ICD-10-CM

## 2014-08-12 DIAGNOSIS — R350 Frequency of micturition: Secondary | ICD-10-CM

## 2014-08-12 DIAGNOSIS — Z113 Encounter for screening for infections with a predominantly sexual mode of transmission: Secondary | ICD-10-CM

## 2014-08-12 DIAGNOSIS — N760 Acute vaginitis: Secondary | ICD-10-CM

## 2014-08-12 LAB — POCT WET PREP WITH KOH
Clue Cells Wet Prep HPF POC: NEGATIVE
KOH Prep POC: NEGATIVE
RBC Wet Prep HPF POC: NEGATIVE
Trichomonas, UA: NEGATIVE
Yeast Wet Prep HPF POC: NEGATIVE

## 2014-08-12 LAB — POCT UA - MICROSCOPIC ONLY
BACTERIA, U MICROSCOPIC: NEGATIVE
CASTS, UR, LPF, POC: NEGATIVE
CRYSTALS, UR, HPF, POC: NEGATIVE
MUCUS UA: NEGATIVE
RBC, urine, microscopic: NEGATIVE
WBC, Ur, HPF, POC: NEGATIVE
YEAST UA: NEGATIVE

## 2014-08-12 LAB — POCT URINALYSIS DIPSTICK
Bilirubin, UA: NEGATIVE
GLUCOSE UA: NEGATIVE
Ketones, UA: NEGATIVE
Leukocytes, UA: NEGATIVE
NITRITE UA: NEGATIVE
PH UA: 6
PROTEIN UA: NEGATIVE
RBC UA: NEGATIVE
UROBILINOGEN UA: 0.2

## 2014-08-12 LAB — POCT URINE PREGNANCY: Preg Test, Ur: NEGATIVE

## 2014-08-12 MED ORDER — FLUCONAZOLE 150 MG PO TABS: 150.0000 mg | ORAL_TABLET | Freq: Once | ORAL | Status: DC

## 2014-08-12 NOTE — Progress Notes (Signed)
Chief Complaint:  Chief Complaint  Patient presents with  . Dysuria    x2 days  . Vaginal Discharge    milk colored discharge  . burning sensation    when urinating  . vaginal itching  . Urinary Frequency    HPI: Shari Murray is a 28 y.o. female who is here for dysuria 2 days of burning pain with uriantion, vaginal itching urinary frequency,  Mostly itches where she urinates, she has no rash. Has White and clumpy discahrge LMP was 07/31/14.  She has not had a pap in 2 years and was normal, she had an abnormal pap in 2005.  She is also concerned about STDs, she is married with child but got frunk and slept with a friend without a condom, and sheis not sure if the friend practices safe sex withhis other partners. He obviously did not use it with her though.  NO prior hx of STD, she has ahd yeast and BV in the past.   Past Medical History  Diagnosis Date  . Anemia   . TMLYYTKP(546.5)    Past Surgical History  Procedure Laterality Date  . No past surgeries     History   Social History  . Marital Status: Married    Spouse Name: Cheryl Chay    Number of Children: 1  . Years of Education: N/A   Occupational History  . CNA    Social History Main Topics  . Smoking status: Current Every Day Smoker -- 0.50 packs/day    Types: Cigarettes    Start date: 12/10/1993  . Smokeless tobacco: Never Used  . Alcohol Use: No  . Drug Use: No  . Sexual Activity: Not Currently    Birth Control/ Protection: None   Other Topics Concern  . None   Social History Narrative   Family History  Problem Relation Age of Onset  . Hypertension Father    Allergies  Allergen Reactions  . Ibuprofen Itching    Able to take advil brand ok  . Shellfish Allergy Hives and Itching   Prior to Admission medications   Medication Sig Start Date End Date Taking? Authorizing Provider  amoxicillin-clavulanate (AUGMENTIN) 875-125 MG per tablet Take 1 tablet by mouth 2 (two) times  daily. 07/31/14  Yes Roselee Culver, MD  folic acid (FOLVITE) 1 MG tablet Take 1 tablet (1 mg total) by mouth daily. 02/16/13  Yes Volanda Napoleon, MD  norethindrone (MICRONOR,CAMILA,ERRIN) 0.35 MG tablet Take 1 tablet (0.35 mg total) by mouth daily. 07/03/13  Yes Shelly Bombard, MD  penicillin v potassium (VEETID) 500 MG tablet Take 1 tablet (500 mg total) by mouth 2 (two) times daily. 01/14/14  Yes Darreld Mclean, MD  Prenatal Vit-Fe Fumarate-FA (PRENATAL MULTIVITAMIN) TABS Take 1 tablet by mouth daily at 12 noon.   Yes Historical Provider, MD  promethazine-codeine (PHENERGAN WITH CODEINE) 6.25-10 MG/5ML syrup Take 5-10 mLs by mouth every 4 (four) hours as needed for cough. 07/31/14  Yes Roselee Culver, MD  pseudoephedrine-guaifenesin [facility] D) 60-600 MG per tablet Take 1 tablet by mouth every 12 (twelve) hours. 07/31/14 07/31/15 Yes Roselee Culver, MD  traMADol (ULTRAM) 50 MG tablet Take 2 tablets (100 mg total) by mouth every 6 (six) hours as needed. 04/11/13  Yes Shelly Bombard, MD     ROS: The patient denies fevers, chills, night sweats, unintentional weight loss, chest pain, palpitations, wheezing, dyspnea on exertion, nausea, vomiting, abdominal pain, dysuria, hematuria, melena,  numbness, weakness, or tingling.  All other systems have been reviewed and were otherwise negative with the exception of those mentioned in the HPI and as above.    PHYSICAL EXAM: Filed Vitals:   08/12/14 1429  BP: 114/66  Pulse: 67  Temp: 98.2 F (36.8 C)  Resp: 16   Filed Vitals:   08/12/14 1429  Height: 4' 9.5" (1.461 m)  Weight: 121 lb 6.4 oz (55.067 kg)   Body mass index is 25.8 kg/(m^2).  General: Alert, no acute distress HEENT:  Normocephalic, atraumatic, oropharynx patent. EOMI, PERRLA Cardiovascular:  Regular rate and rhythm, no rubs murmurs or gallops.  No Carotid bruits, radial pulse intact. No pedal edema.  Respiratory: Clear to auscultation bilaterally.  No wheezes,  rales, or rhonchi.  No cyanosis, no use of accessory musculature GI: No organomegaly, abdomen is soft and non-tender, positive bowel sounds.  No masses. Skin: No rashes. Neurologic: Facial musculature symmetric. Psychiatric: Patient is appropriate throughout our interaction. Lymphatic: No cervical lymphadenopathy Musculoskeletal: Gait intact. GU-no CMT, no rashes, + minimal dc white non chunky    LABS: Results for orders placed or performed in visit on 08/12/14  POCT UA - Microscopic Only  Result Value Ref Range   WBC, Ur, HPF, POC neg    RBC, urine, microscopic neg    Bacteria, U Microscopic neg    Mucus, UA neg    Epithelial cells, urine per micros 2-4    Crystals, Ur, HPF, POC neg    Casts, Ur, LPF, POC neg    Yeast, UA neg   POCT urinalysis dipstick  Result Value Ref Range   Color, UA yellow    Clarity, UA clear    Glucose, UA neg    Bilirubin, UA neg    Ketones, UA neg    Spec Grav, UA <=1.005    Blood, UA neg    pH, UA 6.0    Protein, UA neg    Urobilinogen, UA 0.2    Nitrite, UA neg    Leukocytes, UA Negative   POCT Wet Prep with KOH  Result Value Ref Range   Trichomonas, UA Negative    Clue Cells Wet Prep HPF POC neg    Epithelial Wet Prep HPF POC 2-5    Yeast Wet Prep HPF POC neg    Bacteria Wet Prep HPF POC trace    RBC Wet Prep HPF POC neg    WBC Wet Prep HPF POC 6-8    KOH Prep POC Negative   POCT urine pregnancy  Result Value Ref Range   Preg Test, Ur Negative      EKG/XRAY:   Primary read interpreted by Dr. Marin Comment at [facility].   ASSESSMENT/PLAN: Encounter Diagnoses  Name Primary?  . Vaginal discharge Yes  . Frequent urination   . Screening for STD (sexually transmitted disease)    I think she is here more for STD screening than anything else, still ha some vaginal dc with itching, sounds more like yeast so will just give her a presumptive treatment for yeast She is on amoxacillin currently for a URI and so will do this eventhough KOH was negative   STD labs pending Advise patient to practice safe sex  F/u prn  Gross sideeffects, risk and benefits, and alternatives of medications d/w patient. Patient is aware that all medications have potential sideeffects and we are unable to predict every sideeffect or drug-drug interaction that may occur.  Tara Rud, [place], DO 08/12/2014 4:10 PM

## 2014-08-13 LAB — HEPATITIS C ANTIBODY: HCV Ab: NEGATIVE

## 2014-08-13 LAB — RPR

## 2014-08-13 LAB — HEPATITIS B SURFACE ANTIGEN: Hepatitis B Surface Ag: NEGATIVE

## 2014-08-13 LAB — HIV ANTIBODY (ROUTINE TESTING W REFLEX): HIV 1&2 Ab, 4th Generation: NONREACTIVE

## 2014-08-13 LAB — GC/CHLAMYDIA PROBE AMP
CT Probe RNA: NEGATIVE
GC Probe RNA: NEGATIVE

## 2014-08-13 LAB — HSV(HERPES SIMPLEX VRS) I + II AB-IGG
HSV 1 Glycoprotein G Ab, IgG: 6.45 IV — ABNORMAL HIGH
HSV 2 Glycoprotein G Ab, IgG: <0.1 IV

## 2014-08-19 ENCOUNTER — Telehealth: Payer: Self-pay

## 2014-08-19 NOTE — Telephone Encounter (Signed)
Pt calling about lab resultls. Was unable to get on MyChart. Let her know everything was neg.

## 2015-01-20 ENCOUNTER — Encounter: Payer: Self-pay | Admitting: Family Medicine

## 2016-08-13 HISTORY — PX: WISDOM TOOTH EXTRACTION: SHX21

## 2017-04-27 ENCOUNTER — Other Ambulatory Visit (HOSPITAL_COMMUNITY)
Admission: RE | Admit: 2017-04-27 | Discharge: 2017-04-27 | Disposition: A | Discharge status: Home / Self Care | Payer: Commercial Managed Care - PPO | Source: Ambulatory Visit | Attending: Obstetrics | Admitting: Obstetrics

## 2017-04-27 ENCOUNTER — Ambulatory Visit (INDEPENDENT_AMBULATORY_CARE_PROVIDER_SITE_OTHER): Payer: Commercial Managed Care - PPO | Admitting: Obstetrics

## 2017-04-27 ENCOUNTER — Encounter: Payer: Self-pay | Admitting: Obstetrics

## 2017-04-27 VITALS — BP 109/72 | HR 87 | Wt 130.3 lb

## 2017-04-27 DIAGNOSIS — Z113 Encounter for screening for infections with a predominantly sexual mode of transmission: Secondary | ICD-10-CM

## 2017-04-27 DIAGNOSIS — Z331 Pregnant state, incidental: Secondary | ICD-10-CM

## 2017-04-27 DIAGNOSIS — Z1389 Encounter for screening for other disorder: Secondary | ICD-10-CM

## 2017-04-27 DIAGNOSIS — Z3481 Encounter for supervision of other normal pregnancy, first trimester: Secondary | ICD-10-CM

## 2017-04-27 DIAGNOSIS — Z3480 Encounter for supervision of other normal pregnancy, unspecified trimester: Secondary | ICD-10-CM

## 2017-04-27 DIAGNOSIS — Z131 Encounter for screening for diabetes mellitus: Secondary | ICD-10-CM

## 2017-04-27 NOTE — Progress Notes (Signed)
Patient is in the office for initial ob visit, no complaints. Babyscripts

## 2017-04-28 ENCOUNTER — Encounter: Payer: Self-pay | Admitting: Obstetrics

## 2017-04-28 LAB — CERVICOVAGINAL ANCILLARY ONLY
Bacterial vaginitis: NEGATIVE
Candida vaginitis: NEGATIVE
Chlamydia: NEGATIVE
NEISSERIA GONORRHEA: NEGATIVE
TRICH (WINDOWPATH): NEGATIVE

## 2017-04-28 NOTE — Progress Notes (Signed)
Subjective:    Shari Murray is being seen today for her first obstetrical visit.  This is a planned pregnancy. She is at [redacted]w[redacted]d gestation. Her obstetrical history is significant for smoker. Relationship with FOB: spouse, living together. Patient does intend to breast feed. Pregnancy history fully reviewed.  The information documented in the HPI was reviewed and verified.  Menstrual History: OB History    Gravida Para Term Preterm AB Living   3 2 2     2    SAB TAB Ectopic Multiple Live Births           2      Patient's last menstrual period was 02/09/2017.    Past Medical History:  Diagnosis Date  . Anemia   . SAYTKZSW(109.3)     Past Surgical History:  Procedure Laterality Date  . NO PAST SURGERIES    . WISDOM TOOTH EXTRACTION  08/2016     (Not in a hospital admission) Allergies  Allergen Reactions  . Ibuprofen Itching    Able to take advil brand ok  . Shellfish Allergy Hives and Itching    Social History  Substance Use Topics  . Smoking status: Current Some Day Smoker    Packs/day: 0.50    Types: Cigarettes    Start date: 12/10/1993  . Smokeless tobacco: Never Used  . Alcohol use No    Family History  Problem Relation Age of Onset  . Hypertension Father      Review of Systems Constitutional: negative for weight loss Gastrointestinal: negative for vomiting Genitourinary:negative for genital lesions and vaginal discharge and dysuria Musculoskeletal:negative for back pain Behavioral/Psych: negative for abusive relationship, depression, illegal drug usage and tobacco use    Objective:    BP 109/72   Pulse 87   Wt 130 lb 4.8 oz (59.1 kg)   LMP 02/09/2017   BMI 27.71 kg/m  General Appearance:    Alert, cooperative, no distress, appears stated age  Head:    Normocephalic, without obvious abnormality, atraumatic  Eyes:    PERRL, conjunctiva/corneas clear, EOM's intact, fundi    benign, both eyes  Ears:    Normal TM's and external ear canals, both ears   Nose:   Nares normal, septum midline, mucosa normal, no drainage    or sinus tenderness  Throat:   Lips, mucosa, and tongue normal; teeth and gums normal  Neck:   Supple, symmetrical, trachea midline, no adenopathy;    thyroid:  no enlargement/tenderness/nodules; no carotid   bruit or JVD  Back:     Symmetric, no curvature, ROM normal, no CVA tenderness  Lungs:     Clear to auscultation bilaterally, respirations unlabored  Chest Wall:    No tenderness or deformity   Heart:    Regular rate and rhythm, S1 and S2 normal, no murmur, rub   or gallop  Breast Exam:    No tenderness, masses, or nipple abnormality  Abdomen:     Soft, non-tender, bowel sounds active all four quadrants,    no masses, no organomegaly  Genitalia:    Normal female without lesion, discharge or tenderness  Extremities:   Extremities normal, atraumatic, no cyanosis or edema  Pulses:   2+ and symmetric all extremities  Skin:   Skin color, texture, turgor normal, no rashes or lesions  Lymph nodes:   Cervical, supraclavicular, and axillary nodes normal  Neurologic:   CNII-XII intact, normal strength, sensation and reflexes    throughout      Lab Review Urine pregnancy  test Labs reviewed yes Radiologic studies reviewed no Assessment:    Pregnancy at [redacted]w[redacted]d weeks    Plan:      Prenatal vitamins.  Counseling provided regarding continued use of seat belts, cessation of alcohol consumption, smoking or use of illicit drugs; infection precautions i.e., influenza/TDAP immunizations, toxoplasmosis,CMV, parvovirus, listeria and varicella; workplace safety, exercise during pregnancy; routine dental care, safe medications, sexual activity, hot tubs, saunas, pools, travel, caffeine use, fish and methlymercury, potential toxins, hair treatments, varicose veins Weight gain recommendations per IOM guidelines reviewed: underweight/BMI< 18.5--> gain 28 - 40 lbs; normal weight/BMI 18.5 - 24.9--> gain 25 - 35 lbs; overweight/BMI 25 -  29.9--> gain 15 - 25 lbs; obese/BMI >30->gain  11 - 20 lbs Problem list reviewed and updated. FIRST/CF mutation testing/NIPT/QUAD SCREEN/fragile X/[name] population testing/Spinal muscular atrophy discussed: requested. Role of ultrasound in pregnancy discussed; fetal survey: requested. Amniocentesis discussed: not indicated. VBAC calculator score: VBAC consent form provided No orders of the defined types were placed in this encounter.  Orders Placed This Encounter  Procedures  . Culture, OB Urine  . US OB Comp Less 14 Wks    Standing Status:   Future    Standing Expiration Date:   06/27/2018    Order Specific Question:   Reason for Exam (SYMPTOM  OR DIAGNOSIS REQUIRED)    Answer:   Unsure LMP    Order Specific Question:   Preferred imaging location?    Answer:   [facility]  . US OB Transvaginal    Standing Status:   Future    Standing Expiration Date:   06/27/2018    Order Specific Question:   Reason for Exam (SYMPTOM  OR DIAGNOSIS REQUIRED)    Answer:   Unsure LMP    Order Specific Question:   Preferred imaging location?    Answer:   [facility]  . Hemoglobin A1c  . Vitamin D (25 hydroxy)  . Varicella zoster antibody, IgG  . Obstetric Panel, Including HIV    Follow up in 6 weeks. 50% of 20 min visit spent on counseling and coordination of care.

## 2017-04-29 LAB — CULTURE, OB URINE

## 2017-04-29 LAB — URINE CULTURE, OB REFLEX

## 2017-05-02 LAB — OBSTETRIC PANEL, INCLUDING HIV
Antibody Screen: NEGATIVE
Basophils Absolute: 0.1 10*3/uL (ref 0.0–0.2)
Basos: 0 %
EOS (ABSOLUTE): 0.4 10*3/uL (ref 0.0–0.4)
Eos: 2 %
HEP B S AG: NEGATIVE
HIV SCREEN 4TH GENERATION: NONREACTIVE
Hematocrit: 34 % (ref 34.0–46.6)
Hemoglobin: 10.8 g/dL — ABNORMAL LOW (ref 11.1–15.9)
Immature Grans (Abs): 0 10*3/uL (ref 0.0–0.1)
Immature Granulocytes: 0 %
LYMPHS: 23 %
Lymphocytes Absolute: 4.6 10*3/uL — ABNORMAL HIGH (ref 0.7–3.1)
MCH: 19.8 pg — ABNORMAL LOW (ref 26.6–33.0)
MCHC: 31.8 g/dL (ref 31.5–35.7)
MCV: 62 fL — AB (ref 79–97)
Monocytes Absolute: 1.4 10*3/uL — ABNORMAL HIGH (ref 0.1–0.9)
Monocytes: 7 %
NEUTROS ABS: 13.9 10*3/uL — AB (ref 1.4–7.0)
Neutrophils: 68 %
PLATELETS: 368 10*3/uL (ref 150–379)
RBC: 5.46 x10E6/uL — ABNORMAL HIGH (ref 3.77–5.28)
RDW: 18.9 % — ABNORMAL HIGH (ref 12.3–15.4)
RPR Ser Ql: NONREACTIVE
RUBELLA: 2.05 {index} (ref >0.99)
Rh Factor: POSITIVE
WBC: 20.4 10*3/uL — AB (ref 3.4–10.8)

## 2017-05-02 LAB — HEMOGLOBIN A1C
Est. average glucose Bld gHb Est-mCnc: 88 mg/dL
Hgb A1c MFr Bld: 4.7 % — ABNORMAL LOW (ref 4.8–5.6)

## 2017-05-02 LAB — HEMOGLOBINOPATHY EVALUATION
HGB C: 0 %
HGB S: 0 %
HGB VARIANT: 91.9 % — ABNORMAL HIGH
Hemoglobin A2 Quantitation: 6.5 % — ABNORMAL HIGH (ref 1.8–3.2)
Hemoglobin F Quantitation: 1.6 % (ref 0.0–2.0)
Hgb A: 0 % — ABNORMAL LOW (ref 96.4–98.8)

## 2017-05-02 LAB — VITAMIN D 25 HYDROXY (VIT D DEFICIENCY, FRACTURES): Vit D, 25-Hydroxy: 15.9 ng/mL — ABNORMAL LOW (ref 30.0–100.0)

## 2017-05-02 LAB — VARICELLA ZOSTER ANTIBODY, IGG: VARICELLA: 2088 {index} (ref >165)

## 2017-05-03 ENCOUNTER — Other Ambulatory Visit: Payer: Self-pay | Admitting: Obstetrics

## 2017-05-03 DIAGNOSIS — D565 Hemoglobin E-beta thalassemia: Secondary | ICD-10-CM

## 2017-05-03 MED ORDER — FOLIC ACID 1 MG PO TABS: 1.0000 mg | ORAL_TABLET | Freq: Every day | ORAL | 3 refills | Status: DC

## 2017-05-05 ENCOUNTER — Ambulatory Visit (HOSPITAL_COMMUNITY): Payer: Commercial Managed Care - PPO

## 2017-05-06 ENCOUNTER — Ambulatory Visit (HOSPITAL_COMMUNITY)
Admission: RE | Admit: 2017-05-06 | Discharge: 2017-05-06 | Disposition: A | Discharge status: Home / Self Care | Payer: Commercial Managed Care - PPO | Source: Ambulatory Visit | Attending: Obstetrics | Admitting: Obstetrics

## 2017-05-06 DIAGNOSIS — O3481 Maternal care for other abnormalities of pelvic organs, first trimester: Secondary | ICD-10-CM | POA: Diagnosis not present

## 2017-05-06 DIAGNOSIS — Z3480 Encounter for supervision of other normal pregnancy, unspecified trimester: Secondary | ICD-10-CM

## 2017-05-06 DIAGNOSIS — Z3A09 9 weeks gestation of pregnancy: Secondary | ICD-10-CM | POA: Diagnosis not present

## 2017-05-06 DIAGNOSIS — N8312 Corpus luteum cyst of left ovary: Secondary | ICD-10-CM | POA: Diagnosis not present

## 2017-05-07 ENCOUNTER — Other Ambulatory Visit: Payer: Self-pay | Admitting: Obstetrics

## 2017-05-11 ENCOUNTER — Telehealth: Payer: Self-pay

## 2017-05-11 NOTE — Telephone Encounter (Signed)
Contacted pt and advised or results and referral placed by provider.

## 2017-06-03 ENCOUNTER — Other Ambulatory Visit: Payer: Self-pay | Admitting: Obstetrics

## 2017-06-08 ENCOUNTER — Ambulatory Visit (INDEPENDENT_AMBULATORY_CARE_PROVIDER_SITE_OTHER): Payer: Commercial Managed Care - PPO | Admitting: Obstetrics

## 2017-06-08 VITALS — BP 106/68 | HR 89 | Wt 126.8 lb

## 2017-06-08 DIAGNOSIS — Z3480 Encounter for supervision of other normal pregnancy, unspecified trimester: Secondary | ICD-10-CM

## 2017-06-08 DIAGNOSIS — D508 Other iron deficiency anemias: Secondary | ICD-10-CM

## 2017-06-08 DIAGNOSIS — Z3482 Encounter for supervision of other normal pregnancy, second trimester: Secondary | ICD-10-CM

## 2017-06-08 DIAGNOSIS — O99012 Anemia complicating pregnancy, second trimester: Secondary | ICD-10-CM

## 2017-06-08 MED ORDER — FERROUS SULFATE 325 (65 FE) MG PO TABS: 325.0000 mg | ORAL_TABLET | Freq: Two times a day (BID) | ORAL | 5 refills | Status: DC

## 2017-06-09 ENCOUNTER — Encounter: Payer: Self-pay | Admitting: Obstetrics

## 2017-06-09 NOTE — Progress Notes (Signed)
Subjective:  Shari Murray is a 31 y.o. G3P2002 at [redacted]w[redacted]d being seen today for ongoing prenatal care.  She is currently monitored for the following issues for this low-risk pregnancy and has Pregnancy; Vaginitis and vulvovaginitis, unspecified; Poor fetal growth, affecting management of mother, antepartum condition or complication; Normal delivery; and Supervision of other normal pregnancy, antepartum on her problem list.  Patient reports no complaints.  Contractions: Irritability. Vag. Bleeding: None.  Movement: Present. Denies leaking of fluid.   The following portions of the patient's history were reviewed and updated as appropriate: allergies, current medications, past family history, past medical history, past social history, past surgical history and problem list. Problem list updated.  Objective:   Vitals:   06/08/17 1605  BP: 106/68  Pulse: 89  Weight: 126 lb 12.8 oz (57.5 kg)    Fetal Status: Fetal Heart Rate (bpm): 150   Movement: Present     General:  Alert, oriented and cooperative. Patient is in no acute distress.  Skin: Skin is warm and dry. No rash noted.   Cardiovascular: Normal heart rate noted  Respiratory: Normal respiratory effort, no problems with respiration noted  Abdomen: Soft, gravid, appropriate for gestational age. Pain/Pressure: Absent     Pelvic:  Cervical exam deferred        Extremities: Normal range of motion.  Edema: None  Mental Status: Normal mood and affect. Normal behavior. Normal judgment and thought content.   Urinalysis:      Assessment and Plan:  Pregnancy: G3P2002 at [redacted]w[redacted]d  1. Supervision of other normal pregnancy, antepartum Rx: - [place] MFM OB COMP + 14 WK; Future  2. Iron deficiency anemia secondary to inadequate dietary iron intake Rx: - ferrous sulfate 325 (65 FE) MG tablet; Take 1 tablet (325 mg total) by mouth 3 (three) times daily - between meals and at bedtime.  Dispense: 60 tablet; Refill: 5  Preterm labor symptoms and  general obstetric precautions including but not limited to vaginal bleeding, contractions, leaking of fluid and fetal movement were reviewed in detail with the patient. Please refer to After Visit Summary for other counseling recommendations.  Return in about 6 weeks (around 07/20/2017) for ROB.   Shelly Bombard, MD

## 2017-06-15 ENCOUNTER — Other Ambulatory Visit: Payer: Self-pay | Admitting: Obstetrics

## 2017-06-15 ENCOUNTER — Ambulatory Visit (HOSPITAL_COMMUNITY)
Admission: RE | Admit: 2017-06-15 | Discharge: 2017-06-15 | Disposition: A | Discharge status: Home / Self Care | Payer: Commercial Managed Care - PPO | Source: Ambulatory Visit | Attending: Obstetrics | Admitting: Obstetrics

## 2017-06-15 ENCOUNTER — Encounter (HOSPITAL_COMMUNITY): Payer: Self-pay

## 2017-06-15 DIAGNOSIS — Z3689 Encounter for other specified antenatal screening: Secondary | ICD-10-CM

## 2017-06-15 DIAGNOSIS — Z3A18 18 weeks gestation of pregnancy: Secondary | ICD-10-CM

## 2017-06-15 DIAGNOSIS — Z3480 Encounter for supervision of other normal pregnancy, unspecified trimester: Secondary | ICD-10-CM

## 2017-06-17 ENCOUNTER — Other Ambulatory Visit: Payer: Commercial Managed Care - PPO

## 2017-06-17 ENCOUNTER — Ambulatory Visit: Payer: Commercial Managed Care - PPO | Admitting: Family

## 2017-06-23 ENCOUNTER — Other Ambulatory Visit: Payer: Self-pay | Admitting: Family

## 2017-06-23 DIAGNOSIS — D509 Iron deficiency anemia, unspecified: Secondary | ICD-10-CM

## 2017-06-23 DIAGNOSIS — D582 Other hemoglobinopathies: Secondary | ICD-10-CM

## 2017-06-24 ENCOUNTER — Other Ambulatory Visit (HOSPITAL_BASED_OUTPATIENT_CLINIC_OR_DEPARTMENT_OTHER): Payer: Commercial Managed Care - PPO

## 2017-06-24 ENCOUNTER — Ambulatory Visit (HOSPITAL_BASED_OUTPATIENT_CLINIC_OR_DEPARTMENT_OTHER): Payer: Commercial Managed Care - PPO | Admitting: Family

## 2017-06-24 ENCOUNTER — Other Ambulatory Visit: Payer: Self-pay | Admitting: Family

## 2017-06-24 DIAGNOSIS — D509 Iron deficiency anemia, unspecified: Secondary | ICD-10-CM

## 2017-06-24 DIAGNOSIS — D565 Hemoglobin E-beta thalassemia: Secondary | ICD-10-CM | POA: Insufficient documentation

## 2017-06-24 DIAGNOSIS — R05 Cough: Secondary | ICD-10-CM | POA: Diagnosis not present

## 2017-06-24 DIAGNOSIS — Z720 Tobacco use: Secondary | ICD-10-CM

## 2017-06-24 DIAGNOSIS — R0602 Shortness of breath: Secondary | ICD-10-CM

## 2017-06-24 DIAGNOSIS — D582 Other hemoglobinopathies: Secondary | ICD-10-CM

## 2017-06-24 DIAGNOSIS — Z3A16 16 weeks gestation of pregnancy: Secondary | ICD-10-CM | POA: Diagnosis not present

## 2017-06-24 DIAGNOSIS — Z832 Family history of diseases of the blood and blood-forming organs and certain disorders involving the immune mechanism: Secondary | ICD-10-CM

## 2017-06-24 LAB — CBC WITH DIFFERENTIAL (CANCER CENTER ONLY)
BASO#: 0 10*3/uL (ref 0.0–0.2)
BASO%: 0.2 % (ref 0.0–2.0)
EOS ABS: 0.2 10*3/uL (ref 0.0–0.5)
EOS%: 1.3 % (ref 0.0–7.0)
HEMATOCRIT: 30 % — AB (ref 34.8–46.6)
HEMOGLOBIN: 10.6 g/dL — AB (ref 11.6–15.9)
LYMPH#: 3.2 10*3/uL (ref 0.9–3.3)
LYMPH%: 18.3 % (ref 14.0–48.0)
MCH: 20.7 pg — AB (ref 26.0–34.0)
MCHC: 35.3 g/dL (ref 32.0–36.0)
MCV: 59 fL — ABNORMAL LOW (ref 81–101)
MONO#: 1.3 10*3/uL — ABNORMAL HIGH (ref 0.1–0.9)
MONO%: 7.1 % (ref 0.0–13.0)
NEUT%: 73.1 % (ref 39.6–80.0)
NEUTROS ABS: 12.9 10*3/uL — AB (ref 1.5–6.5)
Platelets: 383 10*3/uL (ref 145–400)
RBC: 5.13 10*6/uL (ref 3.70–5.32)
RDW: 16.3 % — ABNORMAL HIGH (ref 11.1–15.7)
WBC: 17.7 10*3/uL — ABNORMAL HIGH (ref 3.9–10.0)

## 2017-06-24 LAB — CMP (CANCER CENTER ONLY)
ALBUMIN: 3.1 g/dL — AB (ref 3.3–5.5)
ALK PHOS: 61 U/L (ref 26–84)
ALT(SGPT): 22 U/L (ref 10–47)
AST: 24 U/L (ref 11–38)
BUN, Bld: 4 mg/dL — ABNORMAL LOW (ref 7–22)
CALCIUM: 9.5 mg/dL (ref 8.0–10.3)
CHLORIDE: 107 meq/L (ref 98–108)
CO2: 27 meq/L (ref 18–33)
Creat: 0.5 mg/dl — ABNORMAL LOW (ref 0.6–1.2)
Glucose, Bld: 79 mg/dL (ref 73–118)
Potassium: 3.8 mEq/L (ref 3.3–4.7)
SODIUM: 141 meq/L (ref 128–145)
Total Bilirubin: 0.5 mg/dl (ref 0.20–1.60)
Total Protein: 7.5 g/dL (ref 6.4–8.1)

## 2017-06-24 LAB — LACTATE DEHYDROGENASE: LDH: 121 U/L — AB (ref 125–245)

## 2017-06-24 LAB — CHCC SATELLITE - SMEAR

## 2017-06-24 LAB — TECHNOLOGIST REVIEW CHCC SATELLITE

## 2017-06-24 LAB — IRON AND TIBC
%SAT: 23 % (ref 21–57)
IRON: 102 ug/dL (ref 41–142)
TIBC: 451 ug/dL — ABNORMAL HIGH (ref 236–444)
UIBC: 349 ug/dL (ref 120–384)

## 2017-06-24 LAB — FERRITIN: Ferritin: 86 ng/mL (ref 9–269)

## 2017-06-24 MED ORDER — FOLIC ACID 1 MG PO TABS: 1.0000 mg | ORAL_TABLET | Freq: Every day | ORAL | 11 refills | Status: DC

## 2017-06-24 NOTE — Progress Notes (Signed)
Hematology/Oncology Consultation   Name: Shari Murray      MRN: [id number]    Location: Room/bed info not found  Date: 06/24/2017 Time:11:26 AM   REFERRING PHYSICIAN: Baltazar Najjar, MD  REASON FOR CONSULT: Hgb E - beta thalassemia    DIAGNOSIS:  Hgb E - beta thalassemia   HISTORY OF PRESENT ILLNESS: Shari Murray is a very pleasant 31 yo female from [place]. She has Hgb E beta thalassemia and we followed along with her 4 years ago with her second pregnancy. She is now 16-[redacted] weeks pregnant with her third child. No history of miscarriage.  She had 2 uneventful deliveries with her first 2 children. She denies any history of bleeding, bruising or petechiae.  She had her wisdom teeth removed last November with no problem.  She has some fatigue and stress which she attributes to staying busy with work and her family.  Her mother is also anemic and takes an iron supplement daily.  The patient is currently taking an oral iron supplement and prenatal vitamin daily She states that when she has a cycle is is irregular, heavy (for first 3 days) and she has clots. She also has severe cramps.  No personal cancer history. Her maternal grandmother had a "blood cancer."  She has had no issue with infections. No fever, chills, n/v, rash, dizziness, chest pain, palpitations, abdominal pain or changes in bowel or bladder habits.  No swelling or tenderness in her extremities. She has numbness in her second toe on her right foot that comes and goes.  Her appetite comes and goes. She is over the nausea with her first trimester. She is staying well hydrated. Her weight is stable.  She is a 1/2 ppd smoker and is hoping to quit with Chantix once she is done breast feeding. She has a dry cough and some occasional SOB with over exertion.  She can not remember her last chest xray.  She does not drink alcohol.   ROS: All other 10 point review of systems is negative.   PAST MEDICAL HISTORY:   Past  Medical History:  Diagnosis Date  . Anemia   . Headache(784.0)     ALLERGIES: Allergies  Allergen Reactions  . Ibuprofen Itching    Able to take advil brand ok  . Shellfish Allergy Hives and Itching      MEDICATIONS:  Current Outpatient Prescriptions on File Prior to Visit  Medication Sig Dispense Refill  . ferrous sulfate 325 (65 FE) MG tablet Take 1 tablet (325 mg total) by mouth 3 (three) times daily - between meals and at bedtime. 60 tablet 5  . Prenatal Vit-Fe Fumarate-FA (PRENATAL MULTIVITAMIN) TABS Take 1 tablet by mouth daily at 12 noon.     Current Facility-Administered Medications on File Prior to Visit  Medication Dose Route Frequency Provider Last Rate Last Dose  . acetaminophen (TYLENOL) tablet 975 mg  975 mg Oral Once Copland, Gay Filler, MD         PAST SURGICAL HISTORY Past Surgical History:  Procedure Laterality Date  . NO PAST SURGERIES    . WISDOM TOOTH EXTRACTION  08/2016    FAMILY HISTORY: Family History  Problem Relation Age of Onset  . Hypertension Father     SOCIAL HISTORY:  reports that she has been smoking Cigarettes.  She started smoking about 23 years ago. She has been smoking about 0.50 packs per day. She has never used smokeless tobacco. She reports that she does not drink alcohol  or use drugs.  PERFORMANCE STATUS: The patient's performance status is 0 - Asymptomatic  PHYSICAL EXAM: Most Recent Vital Signs: Blood pressure (!) 106/59, pulse 80, temperature 98 F (36.7 C), temperature source Oral, resp. rate 16, height 4\' 9"  (1.448 m), weight 125 lb (56.7 kg), last menstrual period 02/09/2017, SpO2 100 %. BP (!) 106/59 (BP Location: Left Arm, Patient Position: Sitting)   Pulse 80   Temp 98 F (36.7 C) (Oral)   Resp 16   Ht 4\' 9"  (1.448 m)   Wt 125 lb (56.7 kg) Comment: 16 wks pregnancy  LMP 02/09/2017   SpO2 100%   BMI 27.05 kg/m   General Appearance:    Alert, cooperative, no distress, appears stated age  Head:    Normocephalic,  without obvious abnormality, atraumatic  Eyes:    PERRL, conjunctiva/corneas clear, EOM's intact, fundi    benign, both eyes        Throat:   Lips, mucosa, and tongue normal; teeth and gums normal  Neck:   Supple, symmetrical, trachea midline, no adenopathy;    thyroid:  no enlargement/tenderness/nodules; no carotid   bruit or JVD  Back:     Symmetric, no curvature, ROM normal, no CVA tenderness  Lungs:     Clear to auscultation bilaterally, respirations unlabored  Chest Wall:    No tenderness or deformity   Heart:    Regular rate and rhythm, S1 and S2 normal, no murmur, rub   or gallop     Abdomen:     Soft, non-tender, bowel sounds active all four quadrants,    no masses, no organomegaly        Extremities:   Extremities normal, atraumatic, no cyanosis or edema  Pulses:   2+ and symmetric all extremities  Skin:   Skin color, texture, turgor normal, no rashes or lesions  Lymph nodes:   Cervical, supraclavicular, and axillary nodes normal  Neurologic:   CNII-XII intact, normal strength, sensation and reflexes    throughout    LABORATORY DATA:  No results found for this or any previous visit (from the past 48 hour(s)).    RADIOGRAPHY: No results found.     PATHOLOGY: None  ASSESSMENT/PLAN: Shari Murray is a very pleasant 30 yo female from [place] with Hgb E beta thalassemia. We followed her with her last pregnancy 4 years ago and she is now [redacted] weeks pregnant with her 3rd baby. She has had a healthy uneventful pregnancy so far.  She is currently on a prenatal vitamin with 0.8 mg of folic acid in it. We will have her add folic acid 1 mg to her regimen. Hgb at this time is 10.6 with an MCV of 59.  We will continue to follow along with her and plan to see her back again in 3 months for repeat lab work and follow-up.  All questions were answered and she is in agreement with the plan. She will contact our office with any questions or concerns. We can certainly see her  much sooner if necessary.  She was discussed with and also seen by Dr. Marin Olp and he is in agreement with the aforementioned.   [facility]      Addendum: I saw and examined the patient with Shari Murray. I agree with her above assessment.  From my point of view, there is actually enough and that needs to be done with her pregnancy. She's had no problems with her past pregnancies.  Being that she is [place], her  hemoglobin E is very common. It is really should have no consequences with respect to, cases from her pregnancy.  She definitely needs the folic acid. We will call in folic acid-1 mg by mouth daily.  I don't see need for any count of ultrasounds.  It was nice to see her again. I saw her about for 5 years ago when she was also pregnant.  We answered her questions. She came in with her husband. As always, she has not aged since we last saw her.  I would like to see her back in 3 months. I think she is due in March.  We spent about 40-45 minutes with her and her husband.  Lattie Haw, MD

## 2017-06-25 LAB — RETICULOCYTES: Reticulocyte Count: 2.4 % (ref 0.6–2.6)

## 2017-06-30 ENCOUNTER — Encounter (HOSPITAL_COMMUNITY): Payer: Self-pay | Admitting: Obstetrics

## 2017-07-08 ENCOUNTER — Ambulatory Visit (HOSPITAL_COMMUNITY)
Admission: RE | Admit: 2017-07-08 | Discharge: 2017-07-08 | Disposition: A | Discharge status: Home / Self Care | Payer: Commercial Managed Care - PPO | Source: Ambulatory Visit | Attending: Obstetrics | Admitting: Obstetrics

## 2017-07-08 DIAGNOSIS — O99012 Anemia complicating pregnancy, second trimester: Secondary | ICD-10-CM | POA: Insufficient documentation

## 2017-07-08 DIAGNOSIS — Z3A18 18 weeks gestation of pregnancy: Secondary | ICD-10-CM | POA: Diagnosis not present

## 2017-07-08 DIAGNOSIS — Z3689 Encounter for other specified antenatal screening: Secondary | ICD-10-CM | POA: Diagnosis present

## 2017-07-20 ENCOUNTER — Ambulatory Visit (INDEPENDENT_AMBULATORY_CARE_PROVIDER_SITE_OTHER): Payer: Commercial Managed Care - PPO | Admitting: Obstetrics

## 2017-07-20 ENCOUNTER — Encounter: Payer: Self-pay | Admitting: Obstetrics

## 2017-07-20 VITALS — BP 101/61 | HR 96 | Wt 127.5 lb

## 2017-07-20 DIAGNOSIS — Z3480 Encounter for supervision of other normal pregnancy, unspecified trimester: Secondary | ICD-10-CM

## 2017-07-20 DIAGNOSIS — K219 Gastro-esophageal reflux disease without esophagitis: Secondary | ICD-10-CM

## 2017-07-20 DIAGNOSIS — Z3482 Encounter for supervision of other normal pregnancy, second trimester: Secondary | ICD-10-CM

## 2017-07-20 MED ORDER — OMEPRAZOLE 20 MG PO CPDR: 20.0000 mg | DELAYED_RELEASE_CAPSULE | Freq: Two times a day (BID) | ORAL | 5 refills | Status: DC

## 2017-07-20 NOTE — Progress Notes (Signed)
Subjective:  Shari Murray is a 31 y.o. G3P2002 at [redacted]w[redacted]d being seen today for ongoing prenatal care.  She is currently monitored for the following issues for this low-risk pregnancy and has Pregnancy; Vaginitis and vulvovaginitis, unspecified; Poor fetal growth, affecting management of mother, antepartum condition or complication; Normal delivery; Supervision of other normal pregnancy, antepartum; and Hgb E-beta thalassemia ([place]) on their problem list.  Patient reports heartburn.  Contractions: Irregular. Vag. Bleeding: None.  Movement: Present. Denies leaking of fluid.   The following portions of the patient's history were reviewed and updated as appropriate: allergies, current medications, past family history, past medical history, past social history, past surgical history and problem list. Problem list updated.  Objective:   Vitals:   07/20/17 1513  BP: 101/61  Pulse: 96  Weight: 127 lb 8 oz (57.8 kg)    Fetal Status: Fetal Heart Rate (bpm): 150   Movement: Present     General:  Alert, oriented and cooperative. Patient is in no acute distress.  Skin: Skin is warm and dry. No rash noted.   Cardiovascular: Normal heart rate noted  Respiratory: Normal respiratory effort, no problems with respiration noted  Abdomen: Soft, gravid, appropriate for gestational age. Pain/Pressure: Present     Pelvic:  Cervical exam deferred        Extremities: Normal range of motion.  Edema: None  Mental Status: Normal mood and affect. Normal behavior. Normal judgment and thought content.   Urinalysis:      Assessment and Plan:  Pregnancy: G3P2002 at [redacted]w[redacted]d  1. Supervision of other normal pregnancy, antepartum Rx: - AFP TETRA  2. GERD without esophagitis Rx: - omeprazole (PRILOSEC) 20 MG capsule; Take 1 capsule (20 mg total) 2 (two) times daily before a meal by mouth.  Dispense: 60 capsule; Refill: 5  Preterm labor symptoms and general obstetric precautions including but not limited to vaginal  bleeding, contractions, leaking of fluid and fetal movement were reviewed in detail with the patient. Please refer to After Visit Summary for other counseling recommendations.  Return in about 4 weeks (around 08/17/2017) for ROB.   Shelly Bombard, MD

## 2017-08-10 ENCOUNTER — Encounter: Payer: Self-pay | Admitting: *Deleted

## 2017-08-17 ENCOUNTER — Encounter: Payer: Self-pay | Admitting: Obstetrics

## 2017-08-17 ENCOUNTER — Ambulatory Visit (INDEPENDENT_AMBULATORY_CARE_PROVIDER_SITE_OTHER): Payer: Commercial Managed Care - PPO | Admitting: Obstetrics

## 2017-08-17 ENCOUNTER — Other Ambulatory Visit: Payer: Self-pay

## 2017-08-17 ENCOUNTER — Encounter: Payer: Commercial Managed Care - PPO | Admitting: Obstetrics

## 2017-08-17 VITALS — BP 104/64 | HR 98 | Wt 129.7 lb

## 2017-08-17 DIAGNOSIS — Z3482 Encounter for supervision of other normal pregnancy, second trimester: Secondary | ICD-10-CM

## 2017-08-17 DIAGNOSIS — M545 Low back pain, unspecified: Secondary | ICD-10-CM

## 2017-08-17 DIAGNOSIS — Z3480 Encounter for supervision of other normal pregnancy, unspecified trimester: Secondary | ICD-10-CM

## 2017-08-17 MED ORDER — COMFORT FIT MATERNITY SUPP SM MISC: 0 refills | Status: DC

## 2017-08-17 NOTE — Progress Notes (Signed)
Subjective:  Shari Murray is a 31 y.o. G3P2002 at [redacted]w[redacted]d being seen today for ongoing prenatal care.  She is currently monitored for the following issues for this low-risk pregnancy and has Pregnancy; Vaginitis and vulvovaginitis, unspecified; Poor fetal growth, affecting management of mother, antepartum condition or complication; Normal delivery; Supervision of other normal pregnancy, antepartum; and Hgb E-beta thalassemia ([place]) on their problem list.  Patient reports backache and cramping.  Contractions: Irritability. Vag. Bleeding: None.  Movement: Present. Denies leaking of fluid.   The following portions of the patient's history were reviewed and updated as appropriate: allergies, current medications, past family history, past medical history, past social history, past surgical history and problem list. Problem list updated.  Objective:   Vitals:   08/17/17 1518  BP: 104/64  Pulse: 98  Weight: 129 lb 11.2 oz (58.8 kg)    Fetal Status:     Movement: Present     General:  Alert, oriented and cooperative. Patient is in no acute distress.  Skin: Skin is warm and dry. No rash noted.   Cardiovascular: Normal heart rate noted  Respiratory: Normal respiratory effort, no problems with respiration noted  Abdomen: Soft, gravid, appropriate for gestational age. Pain/Pressure: Present     Pelvic:  Cervical exam deferred        Extremities: Normal range of motion.  Edema: None  Mental Status: Normal mood and affect. Normal behavior. Normal judgment and thought content.   Urinalysis:      Assessment and Plan:  Pregnancy: G3P2002 at [redacted]w[redacted]d  1. Supervision of other normal pregnancy, antepartum   Preterm labor symptoms and general obstetric precautions including but not limited to vaginal bleeding, contractions, leaking of fluid and fetal movement were reviewed in detail with the patient. Please refer to After Visit Summary for other counseling recommendations.  Return in about 4 weeks  (around 09/14/2017) for ROB, [facility].   Shelly Bombard, MD

## 2017-08-17 NOTE — Progress Notes (Signed)
Complains of cramps x 4 weeks 5/10.

## 2017-09-13 NOTE — L&D Delivery Note (Signed)
Patient is 32 y.o. [id number] [redacted]w[redacted]d admitted for labor.   Delivery Note At [phone] a viable female was delivered via  SVD, Presentation: cephalic,LOA. APGAR: 8,9 ; weight pending.   Placenta status: spontaneous, intact. Cord: 3 vessel  Anesthesia:  epidural Episiotomy:  none Lacerations:  none Suture Repair: n/a Est. Blood Loss (mL): 200  Mom to postpartum.  Baby to Couplet care / Skin to Skin.  Dannielle Huh, DO [place] Fellow

## 2017-09-16 ENCOUNTER — Ambulatory Visit (INDEPENDENT_AMBULATORY_CARE_PROVIDER_SITE_OTHER): Payer: Commercial Managed Care - PPO | Admitting: Obstetrics

## 2017-09-16 ENCOUNTER — Other Ambulatory Visit: Payer: Commercial Managed Care - PPO

## 2017-09-16 ENCOUNTER — Encounter: Payer: Self-pay | Admitting: Obstetrics

## 2017-09-16 VITALS — BP 108/68 | HR 101 | Wt 132.0 lb

## 2017-09-16 DIAGNOSIS — Z3480 Encounter for supervision of other normal pregnancy, unspecified trimester: Secondary | ICD-10-CM

## 2017-09-16 DIAGNOSIS — Z3483 Encounter for supervision of other normal pregnancy, third trimester: Secondary | ICD-10-CM

## 2017-09-16 NOTE — Progress Notes (Signed)
Subjective:  Shari Murray is a 32 y.o. G3P2002 at [redacted]w[redacted]d being seen today for ongoing prenatal care.  She is currently monitored for the following issues for this low-risk pregnancy and has Pregnancy; Vaginitis and vulvovaginitis, unspecified; Poor fetal growth, affecting management of mother, antepartum condition or complication; Normal delivery; Supervision of other normal pregnancy, antepartum; and Hgb E-beta thalassemia ([place]) on their problem list.  Patient reports no complaints.  Contractions: Irregular. Vag. Bleeding: None.  Movement: Present. Denies leaking of fluid.   The following portions of the patient's history were reviewed and updated as appropriate: allergies, current medications, past family history, past medical history, past social history, past surgical history and problem list. Problem list updated.  Objective:   Vitals:   09/16/17 0824  BP: 108/68  Pulse: (!) 101  Weight: 132 lb (59.9 kg)    Fetal Status: Fetal Heart Rate (bpm): 150   Movement: Present     General:  Alert, oriented and cooperative. Patient is in no acute distress.  Skin: Skin is warm and dry. No rash noted.   Cardiovascular: Normal heart rate noted  Respiratory: Normal respiratory effort, no problems with respiration noted  Abdomen: Soft, gravid, appropriate for gestational age. Pain/Pressure: Absent     Pelvic:  Cervical exam deferred        Extremities: Normal range of motion.  Edema: None  Mental Status: Normal mood and affect. Normal behavior. Normal judgment and thought content.   Urinalysis:      Assessment and Plan:  Pregnancy: G3P2002 at [redacted]w[redacted]d  1. Supervision of other normal pregnancy, antepartum Rx: - Glucose Tolerance, 2 Hours w/1 Hour - CBC - HIV antibody - RPR  Preterm labor symptoms and general obstetric precautions including but not limited to vaginal bleeding, contractions, leaking of fluid and fetal movement were reviewed in detail with the patient. Please refer to  After Visit Summary for other counseling recommendations.  Return in about 2 weeks (around 09/30/2017) for ROB.   Shelly Bombard, MD

## 2017-09-17 LAB — CBC
HEMATOCRIT: 30.3 % — AB (ref 34.0–46.6)
HEMOGLOBIN: 9.6 g/dL — AB (ref 11.1–15.9)
MCH: 20.3 pg — ABNORMAL LOW (ref 26.6–33.0)
MCHC: 31.7 g/dL (ref 31.5–35.7)
MCV: 64 fL — ABNORMAL LOW (ref 79–97)
Platelets: 410 10*3/uL — ABNORMAL HIGH (ref 150–379)
RBC: 4.73 x10E6/uL (ref 3.77–5.28)
RDW: 18.4 % — AB (ref 12.3–15.4)
WBC: 20.2 10*3/uL — AB (ref 3.4–10.8)

## 2017-09-17 LAB — GLUCOSE TOLERANCE, 2 HOURS W/ 1HR
GLUCOSE, 1 HOUR: 164 mg/dL (ref 65–179)
GLUCOSE, 2 HOUR: 120 mg/dL (ref 65–152)
Glucose, Fasting: 80 mg/dL (ref 65–91)

## 2017-09-17 LAB — RPR: RPR: NONREACTIVE

## 2017-09-17 LAB — HIV ANTIBODY (ROUTINE TESTING W REFLEX): HIV SCREEN 4TH GENERATION: NONREACTIVE

## 2017-09-22 ENCOUNTER — Other Ambulatory Visit: Payer: Self-pay | Admitting: Family

## 2017-09-22 DIAGNOSIS — D508 Other iron deficiency anemias: Secondary | ICD-10-CM

## 2017-09-22 DIAGNOSIS — D565 Hemoglobin E-beta thalassemia: Secondary | ICD-10-CM

## 2017-09-23 ENCOUNTER — Encounter: Payer: Self-pay | Admitting: Family

## 2017-09-23 ENCOUNTER — Inpatient Hospital Stay: Payer: Commercial Managed Care - PPO

## 2017-09-23 ENCOUNTER — Other Ambulatory Visit: Payer: Self-pay

## 2017-09-23 ENCOUNTER — Inpatient Hospital Stay: Payer: Commercial Managed Care - PPO | Attending: Family | Admitting: Family

## 2017-09-23 VITALS — BP 109/61 | HR 98 | Temp 98.9°F | Resp 16 | Wt 134.0 lb

## 2017-09-23 DIAGNOSIS — Z331 Pregnant state, incidental: Secondary | ICD-10-CM | POA: Insufficient documentation

## 2017-09-23 DIAGNOSIS — D508 Other iron deficiency anemias: Secondary | ICD-10-CM

## 2017-09-23 DIAGNOSIS — D561 Beta thalassemia: Secondary | ICD-10-CM | POA: Insufficient documentation

## 2017-09-23 DIAGNOSIS — D565 Hemoglobin E-beta thalassemia: Secondary | ICD-10-CM

## 2017-09-23 DIAGNOSIS — Z79899 Other long term (current) drug therapy: Secondary | ICD-10-CM | POA: Insufficient documentation

## 2017-09-23 LAB — CMP (CANCER CENTER ONLY)
ALT: 8 U/L (ref 0–55)
ANION GAP: 11 (ref 3–11)
AST: 11 U/L (ref 5–34)
Albumin: 3 g/dL — ABNORMAL LOW (ref 3.5–5.0)
Alkaline Phosphatase: 87 U/L (ref 40–150)
BUN: 4 mg/dL — ABNORMAL LOW (ref 7–26)
CHLORIDE: 104 mmol/L (ref 98–109)
CO2: 22 mmol/L (ref 22–29)
Calcium: 8.7 mg/dL (ref 8.4–10.4)
Creatinine: 0.63 mg/dL (ref 0.60–1.10)
GFR, Estimated: >60 mL/min (ref >60)
Glucose, Bld: 113 mg/dL (ref 70–140)
Potassium: 3.3 mmol/L (ref 3.3–4.7)
SODIUM: 137 mmol/L (ref 136–145)
Total Bilirubin: 0.2 mg/dL (ref 0.2–1.2)
Total Protein: 7 g/dL (ref 6.4–8.3)

## 2017-09-23 LAB — CBC WITH DIFFERENTIAL (CANCER CENTER ONLY)
BASOS ABS: 0 10*3/uL (ref 0.0–0.1)
BASOS PCT: 0 %
EOS ABS: 0.2 10*3/uL (ref 0.0–0.5)
Eosinophils Relative: 1 %
HCT: 27 % — ABNORMAL LOW (ref 34.8–46.6)
Hemoglobin: 9.3 g/dL — ABNORMAL LOW (ref 11.6–15.9)
Lymphocytes Relative: 17 %
Lymphs Abs: 3.6 10*3/uL — ABNORMAL HIGH (ref 0.9–3.3)
MCH: 20.9 pg — AB (ref 26.0–34.0)
MCHC: 34.4 g/dL (ref 32.0–36.0)
MCV: 60.8 fL — ABNORMAL LOW (ref 81.0–101.0)
MONO ABS: 1.5 10*3/uL — AB (ref 0.1–0.9)
Monocytes Relative: 7 %
NEUTROS PCT: 75 %
Neutro Abs: 15.6 10*3/uL — ABNORMAL HIGH (ref 1.5–6.5)
PLATELETS: 385 10*3/uL (ref 145–400)
RBC: 4.44 MIL/uL (ref 3.70–5.32)
RDW: 16.6 % — ABNORMAL HIGH (ref 11.1–15.7)
WBC Count: 20.9 10*3/uL — ABNORMAL HIGH (ref 3.9–10.3)

## 2017-09-23 NOTE — Progress Notes (Signed)
Hematology and Oncology Follow Up Visit  Shari Murray [id number] July 03, 1986 32 y.o. 09/23/2017   Principle Diagnosis:  Hgb E - beta thalassemia   Current Therapy:   Folic acid 1 mg PO daily    Interim History:  Shari Murray is here today for follow-up. She is doing well and almost [redacted] weeks pregnant with a little girl they plan to name Shari Murray. She is due on March 25th.  So far she has done well. She has had very little fatigue.  She states that she takes her folic acid when she remembers. Hgb is stable at 9.3 with an MCV of 60.8.  No bleeding, bruising or petechiae.  No fever, chills, n/v, cough, rash, dizziness, SOB, chest pain, palpitations, abdominal pain or changes in bowel or bladder habits.  No swelling, tenderness, numbness or tingling in her extremities. No c/o pain.  She has maintained a good appetite and is staying well hydrated. Her weight is stable.   ECOG Performance Status: 0 - Asymptomatic  Medications:  Allergies as of 09/23/2017      Reactions   Ibuprofen Itching   Able to take advil brand ok   Shellfish Allergy Hives, Itching      Medication List        Accurate as of 09/23/17  1:21 PM. Always use your most recent med list.          COMFORT FIT MATERNITY SUPP SM Misc Wear as directed.   ferrous sulfate 325 (65 FE) MG tablet Take 1 tablet (325 mg total) by mouth 3 (three) times daily - between meals and at bedtime.   folic acid 1 MG tablet Commonly known as:  FOLVITE Take 1 tablet (1 mg total) by mouth daily.   omeprazole 20 MG capsule Commonly known as:  PRILOSEC Take 1 capsule (20 mg total) 2 (two) times daily before a meal by mouth.   prenatal multivitamin Tabs tablet Take 1 tablet by mouth daily at 12 noon.       Allergies:  Allergies  Allergen Reactions  . Ibuprofen Itching    Able to take advil brand ok  . Shellfish Allergy Hives and Itching    Past Medical History, Surgical history, Social history, and Family History were  reviewed and updated.  Review of Systems: All other 10 point review of systems is negative.   Physical Exam:  vitals were not taken for this visit.   Wt Readings from Last 3 Encounters:  09/16/17 132 lb (59.9 kg)  08/17/17 129 lb 11.2 oz (58.8 kg)  07/20/17 127 lb 8 oz (57.8 kg)    Ocular: Sclerae unicteric, pupils equal, round and reactive to light Ear-nose-throat: Oropharynx clear, dentition fair Lymphatic: No cervical, supraclavicular or axillary adenopathy Lungs no rales or rhonchi, good excursion bilaterally Heart regular rate and rhythm, no murmur appreciated Abd soft, nontender, positive bowel sounds, no liver or spleen tip palpated on exam, no fluid wave  MSK no focal spinal tenderness, no joint edema Neuro: non-focal, well-oriented, appropriate affect Breasts: Deferred   Lab Results  Component Value Date   WBC 20.2 (HH) 09/16/2017   HGB 9.6 (L) 09/16/2017   HCT 30.3 (L) 09/16/2017   MCV 64 (L) 09/16/2017   PLT 410 (H) 09/16/2017   Lab Results  Component Value Date   FERRITIN 86 06/24/2017   IRON 102 06/24/2017   TIBC 451 (H) 06/24/2017   UIBC 349 06/24/2017   IRONPCTSAT 23 06/24/2017   Lab Results  Component Value Date   RETICCTPCT  2.9 (H) 02/16/2013   RBC 4.73 09/16/2017   RETICCTABS 135.4 02/16/2013   No results found for: KPAFRELGTCHN, LAMBDASER, KAPLAMBRATIO No results found for: IGGSERUM, IGA, IGMSERUM No results found for: Odetta Pink, SPEI   Chemistry      Component Value Date/Time   NA 141 06/24/2017 1108   K 3.8 06/24/2017 1108   CL 107 06/24/2017 1108   CO2 27 06/24/2017 1108   BUN 4 (L) 06/24/2017 1108   CREATININE 0.5 (L) 06/24/2017 1108      Component Value Date/Time   CALCIUM 9.5 06/24/2017 1108   ALKPHOS 61 06/24/2017 1108   AST 24 06/24/2017 1108   ALT 22 06/24/2017 1108   BILITOT 0.50 06/24/2017 1108      Impression and Plan: Shari Murray is a very pleasant 32 yo  [name] with Hgb E beta thalassemia. She continues to do well and is now [redacted] weeks pregnant.  She takes her folic acid intermittently when she remembers. Hgb is stable at 9.3 with an MCV of 60.8.  We will plan to see her back in another 8 weeks for follow-up.  She will contact our office with any questions or concerns. We can certainly see her sooner if need be.   Laverna Peace, NP 1/11/20191:21 PM

## 2017-09-26 LAB — IRON AND TIBC
IRON: 54 ug/dL (ref 41–142)
Saturation Ratios: 10 % — ABNORMAL LOW (ref 21–57)
TIBC: 554 ug/dL — ABNORMAL HIGH (ref 236–444)
UIBC: 500 ug/dL

## 2017-09-26 LAB — FERRITIN: Ferritin: 19 ng/mL (ref 9–269)

## 2017-09-27 ENCOUNTER — Other Ambulatory Visit: Payer: Self-pay | Admitting: Family

## 2017-09-27 ENCOUNTER — Encounter: Payer: Self-pay | Admitting: *Deleted

## 2017-09-27 DIAGNOSIS — D508 Other iron deficiency anemias: Secondary | ICD-10-CM

## 2017-09-27 DIAGNOSIS — D509 Iron deficiency anemia, unspecified: Secondary | ICD-10-CM | POA: Insufficient documentation

## 2017-09-30 ENCOUNTER — Ambulatory Visit (INDEPENDENT_AMBULATORY_CARE_PROVIDER_SITE_OTHER): Payer: Commercial Managed Care - PPO | Admitting: Obstetrics

## 2017-09-30 ENCOUNTER — Encounter: Payer: Self-pay | Admitting: Obstetrics

## 2017-09-30 VITALS — BP 102/68 | HR 101 | Wt 133.2 lb

## 2017-09-30 DIAGNOSIS — Z3483 Encounter for supervision of other normal pregnancy, third trimester: Secondary | ICD-10-CM

## 2017-09-30 DIAGNOSIS — D649 Anemia, unspecified: Secondary | ICD-10-CM

## 2017-09-30 DIAGNOSIS — Z3480 Encounter for supervision of other normal pregnancy, unspecified trimester: Secondary | ICD-10-CM

## 2017-09-30 NOTE — Progress Notes (Signed)
Subjective:  Shari Murray is a 32 y.o. G3P2002 at [redacted]w[redacted]d being seen today for ongoing prenatal care.  She is currently monitored for the following issues for this high-risk pregnancy and has Pregnancy; Vaginitis and vulvovaginitis, unspecified; Poor fetal growth, affecting management of mother, antepartum condition or complication; Normal delivery; Supervision of other normal pregnancy, antepartum; Hgb E-beta thalassemia ([facility]); and IDA (iron deficiency anemia) on their problem list.  Patient reports no complaints.  Contractions: Irregular. Vag. Bleeding: None.  Movement: Present. Denies leaking of fluid.   The following portions of the patient's history were reviewed and updated as appropriate: allergies, current medications, past family history, past medical history, past social history, past surgical history and problem list. Problem list updated.  Objective:   Vitals:   09/30/17 0856  BP: 102/68  Pulse: (!) 101  Weight: 133 lb 3.2 oz (60.4 kg)    Fetal Status: Fetal Heart Rate (bpm): 150   Movement: Present     General:  Alert, oriented and cooperative. Patient is in no acute distress.  Skin: Skin is warm and dry. No rash noted.   Cardiovascular: Normal heart rate noted  Respiratory: Normal respiratory effort, no problems with respiration noted  Abdomen: Soft, gravid, appropriate for gestational age. Pain/Pressure: Absent     Pelvic:  Cervical exam deferred        Extremities: Normal range of motion.  Edema: None  Mental Status: Normal mood and affect. Normal behavior. Normal judgment and thought content.   Urinalysis:      Assessment and Plan:  Pregnancy: G3P2002 at [redacted]w[redacted]d  There are no diagnoses linked to this encounter. Preterm labor symptoms and general obstetric precautions including but not limited to vaginal bleeding, contractions, leaking of fluid and fetal movement were reviewed in detail with the patient. Please refer to After Visit Summary for other counseling  recommendations.  Return in about 2 weeks (around 10/14/2017) for [place].   Shelly Bombard, MD

## 2017-10-04 ENCOUNTER — Other Ambulatory Visit (HOSPITAL_COMMUNITY): Payer: Self-pay | Admitting: *Deleted

## 2017-10-05 ENCOUNTER — Ambulatory Visit (HOSPITAL_COMMUNITY)
Admission: RE | Admit: 2017-10-05 | Discharge: 2017-10-05 | Disposition: A | Discharge status: Home / Self Care | Payer: Commercial Managed Care - PPO | Source: Ambulatory Visit | Attending: Hematology & Oncology | Admitting: Hematology & Oncology

## 2017-10-05 DIAGNOSIS — D509 Iron deficiency anemia, unspecified: Secondary | ICD-10-CM | POA: Diagnosis not present

## 2017-10-05 MED ORDER — SODIUM CHLORIDE 0.9 % IV SOLN
510.0000 mg | Freq: Once | INTRAVENOUS | Status: AC
  Administered 2017-10-05: 510 mg via INTRAVENOUS
  Filled 2017-10-05: qty 17

## 2017-10-05 NOTE — Discharge Instructions (Signed)

## 2017-10-14 ENCOUNTER — Ambulatory Visit (INDEPENDENT_AMBULATORY_CARE_PROVIDER_SITE_OTHER): Payer: Commercial Managed Care - PPO | Admitting: Obstetrics

## 2017-10-14 ENCOUNTER — Encounter: Payer: Self-pay | Admitting: Obstetrics

## 2017-10-14 DIAGNOSIS — Z3483 Encounter for supervision of other normal pregnancy, third trimester: Secondary | ICD-10-CM

## 2017-10-14 DIAGNOSIS — Z3480 Encounter for supervision of other normal pregnancy, unspecified trimester: Secondary | ICD-10-CM

## 2017-10-14 NOTE — Progress Notes (Signed)
Subjective:  Shari Murray is a 32 y.o. G3P2002 at [redacted]w[redacted]d being seen today for ongoing prenatal care.  She is currently monitored for the following issues for this low-risk pregnancy and has Pregnancy; Vaginitis and vulvovaginitis, unspecified; Poor fetal growth, affecting management of mother, antepartum condition or complication; Normal delivery; Supervision of other normal pregnancy, antepartum; Hgb E-beta thalassemia ([facility]); and IDA (iron deficiency anemia) on their problem list.  Patient reports no complaints.  Contractions: Irregular. Vag. Bleeding: None.  Movement: Present. Denies leaking of fluid.   The following portions of the patient's history were reviewed and updated as appropriate: allergies, current medications, past family history, past medical history, past social history, past surgical history and problem list. Problem list updated.  Objective:   Vitals:   10/14/17 0858  BP: 99/65  Pulse: 94  Weight: 135 lb 12.8 oz (61.6 kg)    Fetal Status: Fetal Heart Rate (bpm): 150   Movement: Present     General:  Alert, oriented and cooperative. Patient is in no acute distress.  Skin: Skin is warm and dry. No rash noted.   Cardiovascular: Normal heart rate noted  Respiratory: Normal respiratory effort, no problems with respiration noted  Abdomen: Soft, gravid, appropriate for gestational age. Pain/Pressure: Present     Pelvic:  Cervical exam deferred        Extremities: Normal range of motion.  Edema: None  Mental Status: Normal mood and affect. Normal behavior. Normal judgment and thought content.   Urinalysis:      Assessment and Plan:  Pregnancy: G3P2002 at [redacted]w[redacted]d  1. Supervision of other normal pregnancy, antepartum   Preterm labor symptoms and general obstetric precautions including but not limited to vaginal bleeding, contractions, leaking of fluid and fetal movement were reviewed in detail with the patient. Please refer to After Visit Summary for other counseling  recommendations.  Return in about 2 weeks (around 10/28/2017) for ROB.   Shelly Bombard, MD

## 2017-10-20 DIAGNOSIS — Z3483 Encounter for supervision of other normal pregnancy, third trimester: Secondary | ICD-10-CM

## 2017-10-28 ENCOUNTER — Encounter: Payer: Self-pay | Admitting: Obstetrics

## 2017-10-28 ENCOUNTER — Ambulatory Visit (INDEPENDENT_AMBULATORY_CARE_PROVIDER_SITE_OTHER): Payer: Commercial Managed Care - PPO | Admitting: Obstetrics

## 2017-10-28 VITALS — BP 102/68 | HR 88 | Wt 136.0 lb

## 2017-10-28 DIAGNOSIS — Z3483 Encounter for supervision of other normal pregnancy, third trimester: Secondary | ICD-10-CM

## 2017-10-28 DIAGNOSIS — Z23 Encounter for immunization: Secondary | ICD-10-CM | POA: Diagnosis not present

## 2017-10-28 DIAGNOSIS — D565 Hemoglobin E-beta thalassemia: Secondary | ICD-10-CM

## 2017-10-28 DIAGNOSIS — Z3480 Encounter for supervision of other normal pregnancy, unspecified trimester: Secondary | ICD-10-CM

## 2017-10-28 NOTE — Progress Notes (Signed)
Subjective:  Shari Murray is a 32 y.o. G3P2002 at [redacted]w[redacted]d being seen today for ongoing prenatal care.  She is currently monitored for the following issues for this low-risk pregnancy and has Pregnancy; Vaginitis and vulvovaginitis, unspecified; Poor fetal growth, affecting management of mother, antepartum condition or complication; Normal delivery; Supervision of other normal pregnancy, antepartum; Hgb E-beta thalassemia (HCC); and IDA (iron deficiency anemia) on their problem list.  Patient reports no complaints.  Contractions: Irregular. Vag. Bleeding: None.  Movement: Present. Denies leaking of fluid.   The following portions of the patient's history were reviewed and updated as appropriate: allergies, current medications, past family history, past medical history, past social history, past surgical history and problem list. Problem list updated.  Objective:   Vitals:   10/28/17 0934  BP: 102/68  Pulse: 88  Weight: 136 lb (61.7 kg)    Fetal Status: Fetal Heart Rate (bpm): 150   Movement: Present     General:  Alert, oriented and cooperative. Patient is in no acute distress.  Skin: Skin is warm and dry. No rash noted.   Cardiovascular: Normal heart rate noted  Respiratory: Normal respiratory effort, no problems with respiration noted  Abdomen: Soft, gravid, appropriate for gestational age. Pain/Pressure: Absent     Pelvic:  Cervical exam deferred        Extremities: Normal range of motion.  Edema: None  Mental Status: Normal mood and affect. Normal behavior. Normal judgment and thought content.   Urinalysis:      Assessment and Plan:  Pregnancy: G3P2002 at [redacted]w[redacted]d  1. Supervision of other normal pregnancy, antepartum  2. Encounter for immunization Rx: - Tdap vaccine greater than or equal to 7yo IM  3. Hb E beta + thalassemia (HCC) Rx: - followed by Hematology - received iron transfusion this past week ( Hgb = 9 grams pre- transfusion )    Preterm labor symptoms and  general obstetric precautions including but not limited to vaginal bleeding, contractions, leaking of fluid and fetal movement were reviewed in detail with the patient. Please refer to After Visit Summary for other counseling recommendations.  Return in about 2 weeks (around 11/11/2017) for [place].   Shelly Bombard, MD

## 2017-11-11 ENCOUNTER — Ambulatory Visit (INDEPENDENT_AMBULATORY_CARE_PROVIDER_SITE_OTHER): Payer: Commercial Managed Care - PPO | Admitting: Obstetrics and Gynecology

## 2017-11-11 ENCOUNTER — Encounter: Payer: Self-pay | Admitting: Obstetrics and Gynecology

## 2017-11-11 VITALS — BP 103/71 | HR 83 | Wt 138.8 lb

## 2017-11-11 DIAGNOSIS — D565 Hemoglobin E-beta thalassemia: Secondary | ICD-10-CM

## 2017-11-11 DIAGNOSIS — Z3480 Encounter for supervision of other normal pregnancy, unspecified trimester: Secondary | ICD-10-CM

## 2017-11-11 NOTE — Patient Instructions (Signed)
Vaginal Delivery Vaginal delivery means that you will give birth by pushing your baby out of your birth canal (vagina). A team of health care providers will help you before, during, and after vaginal delivery. Birth experiences are unique for every woman and every pregnancy, and birth experiences vary depending on where you choose to give birth. What should I do to prepare for my baby's birth? Before your baby is born, it is important to talk with your health care provider about:  Your labor and delivery preferences. These may include: ? Medicines that you may be given. ? How you will manage your pain. This might include non-medical pain relief techniques or injectable pain relief such as epidural analgesia. ? How you and your baby will be monitored during labor and delivery. ? Who may be in the labor and delivery room with you. ? Your feelings about surgical delivery of your baby (cesarean delivery, or C-section) if this becomes necessary. ? Your feelings about receiving donated blood through an IV tube (blood transfusion) if this becomes necessary.  Whether you are able: ? To take pictures or videos of the birth. ? To eat during labor and delivery. ? To move around, walk, or change positions during labor and delivery.  What to expect after your baby is born, such as: ? Whether delayed umbilical cord clamping and cutting is offered. ? Who will care for your baby right after birth. ? Medicines or tests that may be recommended for your baby. ? Whether breastfeeding is supported in your hospital or birth center. ? How long you will be in the hospital or birth center.  How any medical conditions you have may affect your baby or your labor and delivery experience.  To prepare for your baby's birth, you should also:  Attend all of your health care visits before delivery (prenatal visits) as recommended by your health care provider. This is important.  Prepare your home for your baby's  arrival. Make sure that you have: ? Diapers. ? Baby clothing. ? Feeding equipment. ? Safe sleeping arrangements for you and your baby.  Install a car seat in your vehicle. Have your car seat checked by a certified car seat installer to make sure that it is installed safely.  Think about who will help you with your new baby at home for at least the first several weeks after delivery.  What can I expect when I arrive at the birth center or hospital? Once you are in labor and have been admitted into the hospital or birth center, your health care provider may:  Review your pregnancy history and any concerns you have.  Insert an IV tube into one of your veins. This is used to give you fluids and medicines.  Check your blood pressure, pulse, temperature, and heart rate (vital signs).  Check whether your bag of water (amniotic sac) has broken (ruptured).  Talk with you about your birth plan and discuss pain control options.  Monitoring Your health care provider may monitor your contractions (uterine monitoring) and your baby's heart rate (fetal monitoring). You may need to be monitored:  Often, but not continuously (intermittently).  All the time or for long periods at a time (continuously). Continuous monitoring may be needed if: ? You are taking certain medicines, such as medicine to relieve pain or make your contractions stronger. ? You have pregnancy or labor complications.  Monitoring may be done by:  Placing a special stethoscope or a handheld monitoring device on your abdomen to   check your baby's heartbeat, and feeling your abdomen for contractions. This method of monitoring does not continuously record your baby's heartbeat or your contractions.  Placing monitors on your abdomen (external monitors) to record your baby's heartbeat and the frequency and length of contractions. You may not have to wear external monitors all the time.  Placing monitors inside of your uterus  (internal monitors) to record your baby's heartbeat and the frequency, length, and strength of your contractions. ? Your health care provider may use internal monitors if he or she needs more information about the strength of your contractions or your baby's heart rate. ? Internal monitors are put in place by passing a thin, flexible wire through your vagina and into your uterus. Depending on the type of monitor, it may remain in your uterus or on your baby's head until birth. ? Your health care provider will discuss the benefits and risks of internal monitoring with you and will ask for your permission before inserting the monitors.  Telemetry. This is a type of continuous monitoring that can be done with external or internal monitors. Instead of having to stay in bed, you are able to move around during telemetry. Ask your health care provider if telemetry is an option for you.  Physical exam Your health care provider may perform a physical exam. This may include:  Checking whether your baby is positioned: ? With the head toward your vagina (head-down). This is most common. ? With the head toward the top of your uterus (head-up or breech). If your baby is in a breech position, your health care provider may try to turn your baby to a head-down position so you can deliver vaginally. If it does not seem that your baby can be born vaginally, your provider may recommend surgery to deliver your baby. In rare cases, you may be able to deliver vaginally if your baby is head-up (breech delivery). ? Lying sideways (transverse). Babies that are lying sideways cannot be delivered vaginally.  Checking your cervix to determine: ? Whether it is thinning out (effacing). ? Whether it is opening up (dilating). ? How low your baby has moved into your birth canal.  What are the three stages of labor and delivery?  Normal labor and delivery is divided into the following three stages: Stage 1  Stage 1 is the  longest stage of labor, and it can last for hours or days. Stage 1 includes: ? Early labor. This is when contractions may be irregular, or regular and mild. Generally, early labor contractions are more than 10 minutes apart. ? Active labor. This is when contractions get longer, more regular, more frequent, and more intense. ? The transition phase. This is when contractions happen very close together, are very intense, and may last longer than during any other part of labor.  Contractions generally feel mild, infrequent, and irregular at first. They get stronger, more frequent (about every 2-3 minutes), and more regular as you progress from early labor through active labor and transition.  Many women progress through stage 1 naturally, but you may need help to continue making progress. If this happens, your health care provider may talk with you about: ? Rupturing your amniotic sac if it has not ruptured yet. ? Giving you medicine to help make your contractions stronger and more frequent.  Stage 1 ends when your cervix is completely dilated to 4 inches (10 cm) and completely effaced. This happens at the end of the transition phase. Stage 2  Once   your cervix is completely effaced and dilated to 4 inches (10 cm), you may start to feel an urge to push. It is common for the body to naturally take a rest before feeling the urge to push, especially if you received an epidural or certain other pain medicines. This rest period may last for up to 1-2 hours, depending on your unique labor experience.  During stage 2, contractions are generally less painful, because pushing helps relieve contraction pain. Instead of contraction pain, you may feel stretching and burning pain, especially when the widest part of your baby's head passes through the vaginal opening (crowning).  Your health care provider will closely monitor your pushing progress and your baby's progress through the vagina during stage 2.  Your  health care provider may massage the area of skin between your vaginal opening and anus (perineum) or apply warm compresses to your perineum. This helps it stretch as the baby's head starts to crown, which can help prevent perineal tearing. ? In some cases, an incision may be made in your perineum (episiotomy) to allow the baby to pass through the vaginal opening. An episiotomy helps to make the opening of the vagina larger to allow more room for the baby to fit through.  It is very important to breathe and focus so your health care provider can control the delivery of your baby's head. Your health care provider may have you decrease the intensity of your pushing, to help prevent perineal tearing.  After delivery of your baby's head, the shoulders and the rest of the body generally deliver very quickly and without difficulty.  Once your baby is delivered, the umbilical cord may be cut right away, or this may be delayed for 1-2 minutes, depending on your baby's health. This may vary among health care providers, hospitals, and birth centers.  If you and your baby are healthy enough, your baby may be placed on your chest or abdomen to help maintain the baby's temperature and to help you bond with each other. Some mothers and babies start breastfeeding at this time. Your health care team will dry your baby and help keep your baby warm during this time.  Your baby may need immediate care if he or she: ? Showed signs of distress during labor. ? Has a medical condition. ? Was born too early (prematurely). ? Had a bowel movement before birth (meconium). ? Shows signs of difficulty transitioning from being inside the uterus to being outside of the uterus. If you are planning to breastfeed, your health care team will help you begin a feeding. Stage 3  The third stage of labor starts immediately after the birth of your baby and ends after you deliver the placenta. The placenta is an organ that develops  during pregnancy to provide oxygen and nutrients to your baby in the womb.  Delivering the placenta may require some pushing, and you may have mild contractions. Breastfeeding can stimulate contractions to help you deliver the placenta.  After the placenta is delivered, your uterus should tighten (contract) and become firm. This helps to stop bleeding in your uterus. To help your uterus contract and to control bleeding, your health care provider may: ? Give you medicine by injection, through an IV tube, by mouth, or through your rectum (rectally). ? Massage your abdomen or perform a vaginal exam to remove any blood clots that are left in your uterus. ? Empty your bladder by placing a thin, flexible tube (catheter) into your bladder. ? Encourage   you to breastfeed your baby. After labor is over, you and your baby will be monitored closely to ensure that you are both healthy until you are ready to go home. Your health care team will teach you how to care for yourself and your baby. This information is not intended to replace advice given to you by your health care provider. Make sure you discuss any questions you have with your health care provider. Document Released: 06/08/2008 Document Revised: 03/19/2016 Document Reviewed: 09/14/2015 Elsevier Interactive Patient Education  2018 [employer] P[employer] The third trimester is from week 28 through week 40 (months 7 through 9). The third trimester is a time when the unborn baby (fetus) is growing rapidly. At the end of the ninth month, the fetus is about 20 inches in length and weighs 6-10 pounds. Body changes during your third trimester Your body will continue to go through many changes during pregnancy. The changes vary from woman to woman. During the third trimester:  Your weight will continue to increase. You can expect to gain 25-35 pounds (11-16 kg) by the end of the pregnancy.  You may begin to get stretch marks on your  hips, abdomen, and breasts.  You may urinate more often because the fetus is moving lower into your pelvis and pressing on your bladder.  You may develop or continue to have heartburn. This is caused by increased hormones that slow down muscles in the digestive tract.  You may develop or continue to have constipation because increased hormones slow digestion and cause the muscles that push waste through your intestines to relax.  You may develop hemorrhoids. These are swollen veins (varicose veins) in the rectum that can itch or be painful.  You may develop swollen, bulging veins (varicose veins) in your legs.  You may have increased body aches in the pelvis, back, or thighs. This is due to weight gain and increased hormones that are relaxing your joints.  You may have changes in your hair. These can include thickening of your hair, rapid growth, and changes in texture. Some women also have hair loss during or after pregnancy, or hair that feels dry or thin. Your hair will most likely return to normal after your baby is born.  Your breasts will continue to grow and they will continue to become tender. A yellow fluid (colostrum) may leak from your breasts. This is the first milk you are producing for your baby.  Your belly button may stick out.  You may notice more swelling in your hands, face, or ankles.  You may have increased tingling or numbness in your hands, arms, and legs. The skin on your belly may also feel numb.  You may feel short of breath because of your expanding uterus.  You may have more problems sleeping. This can be caused by the size of your belly, increased need to urinate, and an increase in your body's metabolism.  You may notice the fetus "dropping," or moving lower in your abdomen (lightening).  You may have increased vaginal discharge.  You may notice your joints feel loose and you may have pain around your pelvic bone.  What to expect at prenatal visits You  will have prenatal exams every 2 weeks until week 36. Then you will have weekly prenatal exams. During a routine prenatal visit:  You will be weighed to make sure you and the baby are growing normally.  Your blood pressure will be taken.  Your abdomen will be measured  to track your baby's growth.  The fetal heartbeat will be listened to.  Any test results from the previous visit will be discussed.  You may have a cervical check near your due date to see if your cervix has softened or thinned (effaced).  You will be tested for Group B streptococcus. This happens between 35 and 37 weeks.  Your health care provider may ask you:  What your birth plan is.  How you are feeling.  If you are feeling the baby move.  If you have had any abnormal symptoms, such as leaking fluid, bleeding, severe headaches, or abdominal cramping.  If you are using any tobacco products, including cigarettes, chewing tobacco, and electronic cigarettes.  If you have any questions.  Other tests or screenings that may be performed during your third trimester include:  Blood tests that check for low iron levels (anemia).  Fetal testing to check the health, activity level, and growth of the fetus. Testing is done if you have certain medical conditions or if there are problems during the pregnancy.  Nonstress test (NST). This test checks the health of your baby to make sure there are no signs of problems, such as the baby not getting enough oxygen. During this test, a belt is placed around your belly. The baby is made to move, and its heart rate is monitored during movement.  What is false labor? False labor is a condition in which you feel small, irregular tightenings of the muscles in the womb (contractions) that usually go away with rest, changing position, or drinking water. These are called Braxton Hicks contractions. Contractions may last for hours, days, or even weeks before true labor sets in. If  contractions come at regular intervals, become more frequent, increase in intensity, or become painful, you should see your health care provider. What are the signs of labor?  Abdominal cramps.  Regular contractions that start at 10 minutes apart and become stronger and more frequent with time.  Contractions that start on the top of the uterus and spread down to the lower abdomen and back.  Increased pelvic pressure and dull back pain.  A watery or bloody mucus discharge that comes from the vagina.  Leaking of amniotic fluid. This is also known as your "water breaking." It could be a slow trickle or a gush. Let your health care provider know if it has a color or strange odor. If you have any of these signs, call your health care provider right away, even if it is before your due date. Follow these instructions at home: Medicines  Follow your health care provider's instructions regarding medicine use. Specific medicines may be either safe or unsafe to take during pregnancy.  Take a prenatal vitamin that contains at least 600 micrograms (mcg) of folic acid.  If you develop constipation, try taking a stool softener if your health care provider approves. Eating and drinking  Eat a balanced diet that includes fresh fruits and vegetables, whole grains, good sources of protein such as meat, eggs, or tofu, and low-fat dairy. Your health care provider will help you determine the amount of weight gain that is right for you.  Avoid raw meat and uncooked cheese. These carry germs that can cause birth defects in the baby.  If you have low calcium intake from food, talk to your health care provider about whether you should take a daily calcium supplement.  Eat four or five small meals rather than three large meals a day.  Limit  foods that are high in fat and processed sugars, such as fried and sweet foods.  To prevent constipation: ? Drink enough fluid to keep your urine clear or pale  yellow. ? Eat foods that are high in fiber, such as fresh fruits and vegetables, whole grains, and beans. Activity  Exercise only as directed by your health care provider. Most women can continue their usual exercise routine during pregnancy. Try to exercise for 30 minutes at least 5 days a week. Stop exercising if you experience uterine contractions.  Avoid heavy lifting.  Do not exercise in extreme heat or humidity, or at high altitudes.  Wear low-heel, comfortable shoes.  Practice good posture.  You may continue to have sex unless your health care provider tells you otherwise. Relieving pain and discomfort  Take frequent breaks and rest with your legs elevated if you have leg cramps or low back pain.  Take warm sitz baths to soothe any pain or discomfort caused by hemorrhoids. Use hemorrhoid cream if your health care provider approves.  Wear a good support bra to prevent discomfort from breast tenderness.  If you develop varicose veins: ? Wear support pantyhose or compression stockings as told by your healthcare provider. ? Elevate your feet for 15 minutes, 3-4 times a day. Prenatal care  Write down your questions. Take them to your prenatal visits.  Keep all your prenatal visits as told by your health care provider. This is important. Safety  Wear your seat belt at all times when driving.  Make a list of emergency phone numbers, including numbers for family, friends, the hospital, and police and fire departments. General instructions  Avoid cat litter boxes and soil used by cats. These carry germs that can cause birth defects in the baby. If you have a cat, ask someone to clean the litter box for you.  Do not travel far distances unless it is absolutely necessary and only with the approval of your health care provider.  Do not use hot tubs, steam rooms, or saunas.  Do not drink alcohol.  Do not use any products that contain nicotine or tobacco, such as cigarettes and  e-cigarettes. If you need help quitting, ask your health care provider.  Do not use any medicinal herbs or unprescribed drugs. These chemicals affect the formation and growth of the baby.  Do not douche or use tampons or scented sanitary pads.  Do not cross your legs for long periods of time.  To prepare for the arrival of your baby: ? Take prenatal classes to understand, practice, and ask questions about labor and delivery. ? Make a trial run to the hospital. ? Visit the hospital and tour the maternity area. ? Arrange for maternity or paternity leave through employers. ? Arrange for family and friends to take care of pets while you are in the hospital. ? Purchase a rear-facing car seat and make sure you know how to install it in your car. ? Pack your hospital bag. ? Prepare the baby's nursery. Make sure to remove all pillows and stuffed animals from the baby's crib to prevent suffocation.  Visit your dentist if you have not gone during your pregnancy. Use a soft toothbrush to brush your teeth and be gentle when you floss. Contact a health care provider if:  You are unsure if you are in labor or if your water has broken.  You become dizzy.  You have mild pelvic cramps, pelvic pressure, or nagging pain in your abdominal area.  You have  lower back pain.  You have persistent nausea, vomiting, or diarrhea.  You have an unusual or bad smelling vaginal discharge.  You have pain when you urinate. Get help right away if:  Your water breaks before 37 weeks.  You have regular contractions less than 5 minutes apart before 37 weeks.  You have a fever.  You are leaking fluid from your vagina.  You have spotting or bleeding from your vagina.  You have severe abdominal pain or cramping.  You have rapid weight loss or weight gain.  You have shortness of breath with chest pain.  You notice sudden or extreme swelling of your face, hands, ankles, feet, or legs.  Your baby makes  fewer than 10 movements in 2 hours.  You have severe headaches that do not go away when you take medicine.  You have vision changes. Summary  The third trimester is from week 28 through week 40, months 7 through 9. The third trimester is a time when the unborn baby (fetus) is growing rapidly.  During the third trimester, your discomfort may increase as you and your baby continue to gain weight. You may have abdominal, leg, and back pain, sleeping problems, and an increased need to urinate.  During the third trimester your breasts will keep growing and they will continue to become tender. A yellow fluid (colostrum) may leak from your breasts. This is the first milk you are producing for your baby.  False labor is a condition in which you feel small, irregular tightenings of the muscles in the womb (contractions) that eventually go away. These are called Braxton Hicks contractions. Contractions may last for hours, days, or even weeks before true labor sets in.  Signs of labor can include: abdominal cramps; regular contractions that start at 10 minutes apart and become stronger and more frequent with time; watery or bloody mucus discharge that comes from the vagina; increased pelvic pressure and dull back pain; and leaking of amniotic fluid. This information is not intended to replace advice given to you by your health care provider. Make sure you discuss any questions you have with your health care provider. Document Released: 08/24/2001 Document Revised: 02/05/2016 Document Reviewed: 10/31/2012 Elsevier Interactive Patient Education  2017 [employer].

## 2017-11-11 NOTE — Progress Notes (Signed)
Subjective:  Shari Murray is a 32 y.o. G3P2002 at [redacted]w[redacted]d being seen today for ongoing prenatal care.  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  Patient reports vaginal discharge x 1 week.  Contractions: Irregular. Vag. Bleeding: None.  Movement: Present. Denies leaking of fluid.   The following portions of the patient's history were reviewed and updated as appropriate: allergies, current medications, past family history, past medical history, past social history, past surgical history and problem list. Problem list updated.  Objective:   Vitals:   11/11/17 0928  Weight: 138 lb 12.8 oz (63 kg)    Fetal Status:     Movement: Present     General:  Alert, oriented and cooperative. Patient is in no acute distress.  Skin: Skin is warm and dry. No rash noted.   Cardiovascular: Normal heart rate noted  Respiratory: Normal respiratory effort, no problems with respiration noted  Abdomen: Soft, gravid, appropriate for gestational age. Pain/Pressure: Present     Pelvic:  Cervical exam performed        Extremities: Normal range of motion.  Edema: None  Mental Status: Normal mood and affect. Normal behavior. Normal judgment and thought content.   Urinalysis:      Assessment and Plan:  Pregnancy: G3P2002 at [redacted]w[redacted]d  1. Supervision of other normal pregnancy, antepartum Stable Labor precautions Fern negative - Strep Gp B NAA - Cervicovaginal ancillary only  2. Hgb E-beta thalassemia ([facility])  - [place] MFM OB FOLLOW UP; Future  Term labor symptoms and general obstetric precautions including but not limited to vaginal bleeding, contractions, leaking of fluid and fetal movement were reviewed in detail with the patient. Please refer to After Visit Summary for other counseling recommendations.  Return in about 1 week  (around 11/18/2017) for OB visit.   Chancy Milroy, MD

## 2017-11-13 LAB — STREP GP B NAA: STREP GROUP B AG: NEGATIVE

## 2017-11-14 LAB — CERVICOVAGINAL ANCILLARY ONLY
Bacterial vaginitis: NEGATIVE
Candida vaginitis: NEGATIVE
Chlamydia: NEGATIVE
Neisseria Gonorrhea: NEGATIVE
Trichomonas: NEGATIVE

## 2017-11-18 ENCOUNTER — Encounter: Payer: Self-pay | Admitting: Family

## 2017-11-18 ENCOUNTER — Inpatient Hospital Stay: Payer: Commercial Managed Care - PPO | Attending: Family | Admitting: Family

## 2017-11-18 ENCOUNTER — Inpatient Hospital Stay: Payer: Commercial Managed Care - PPO

## 2017-11-18 ENCOUNTER — Other Ambulatory Visit: Payer: Self-pay

## 2017-11-18 VITALS — BP 110/71 | HR 79 | Temp 98.0°F | Resp 18 | Wt 141.0 lb

## 2017-11-18 DIAGNOSIS — D508 Other iron deficiency anemias: Secondary | ICD-10-CM

## 2017-11-18 DIAGNOSIS — D561 Beta thalassemia: Secondary | ICD-10-CM | POA: Diagnosis not present

## 2017-11-18 DIAGNOSIS — Z331 Pregnant state, incidental: Secondary | ICD-10-CM | POA: Insufficient documentation

## 2017-11-18 DIAGNOSIS — D565 Hemoglobin E-beta thalassemia: Secondary | ICD-10-CM

## 2017-11-18 LAB — CMP (CANCER CENTER ONLY)
ALT: 11 U/L (ref 10–47)
AST: 18 U/L (ref 11–38)
Albumin: 2.9 g/dL — ABNORMAL LOW (ref 3.5–5.0)
Alkaline Phosphatase: 102 U/L — ABNORMAL HIGH (ref 26–84)
Anion gap: 9 (ref 5–15)
BUN: 6 mg/dL — ABNORMAL LOW (ref 7–22)
CO2: 26 mmol/L (ref 18–33)
Calcium: 9.2 mg/dL (ref 8.0–10.3)
Chloride: 104 mmol/L (ref 98–108)
Creatinine: 0.6 mg/dL (ref 0.60–1.20)
Glucose, Bld: 92 mg/dL (ref 73–118)
Potassium: 3.4 mmol/L (ref 3.3–4.7)
Sodium: 139 mmol/L (ref 128–145)
Total Bilirubin: 0.6 mg/dL (ref 0.2–1.6)
Total Protein: 7.3 g/dL (ref 6.4–8.1)

## 2017-11-18 LAB — CBC WITH DIFFERENTIAL (CANCER CENTER ONLY)
Basophils Absolute: 0 K/uL (ref 0.0–0.1)
Basophils Relative: 0 %
Eosinophils Absolute: 0.1 K/uL (ref 0.0–0.5)
Eosinophils Relative: 1 %
HCT: 31.3 % — ABNORMAL LOW (ref 34.8–46.6)
Hemoglobin: 10.9 g/dL — ABNORMAL LOW (ref 11.6–15.9)
Lymphocytes Relative: 20 %
Lymphs Abs: 3.1 K/uL (ref 0.9–3.3)
MCH: 21.4 pg — ABNORMAL LOW (ref 26.0–34.0)
MCHC: 34.8 g/dL (ref 32.0–36.0)
MCV: 61.5 fL — ABNORMAL LOW (ref 81.0–101.0)
Monocytes Absolute: 1.3 K/uL — ABNORMAL HIGH (ref 0.1–0.9)
Monocytes Relative: 8 %
Neutro Abs: 10.7 K/uL — ABNORMAL HIGH (ref 1.5–6.5)
Neutrophils Relative %: 71 %
Platelet Count: 381 K/uL (ref 145–400)
RBC: 5.09 MIL/uL (ref 3.70–5.32)
RDW: 18.8 % — ABNORMAL HIGH (ref 11.1–15.7)
WBC Count: 15.2 K/uL — ABNORMAL HIGH (ref 3.9–10.0)

## 2017-11-18 NOTE — Progress Notes (Signed)
Hematology and Oncology Follow Up Visit  Zayne Marovich [id number] 1986/06/29 32 y.o. 11/18/2017   Principle Diagnosis:  Hgb E - beta thalassemia  Current Therapy:   Folic acid 1 mg PO daily    Interim History:  Ms. Proffit is here today for follow-up. She is now in her 3rd trimester and due March 25th. She is having Braxton Hicks contractions. She has had no episodes of bleeding, no bruising or petechiae.  She is taking her folic acid daily as prescribed.  Hgb is stable at 10.9, MCV 61. WBC count is 15,2.  She has had no issue with infections. No fever, chills, n/v, cough, rash, dizziness, SOB, chest pain, palpitations, abdominal pain or changes in bowel or bladder habits.  No lymphadenopathy found on exam.  She has some puffiness in her ankles that comes and goes. No tenderness, numbness or tingling in her extremities.  She has maintained a good appetite and is staying well hydrated. Her weight is stable.   ECOG Performance Status: 0 - Asymptomatic  Medications:  Allergies as of 11/18/2017      Reactions   Ibuprofen Itching   Able to take advil brand ok   Shellfish Allergy Hives, Itching      Medication List        Accurate as of 11/18/17  4:09 PM. Always use your most recent med list.          COMFORT FIT MATERNITY SUPP SM Misc Wear as directed.   ferrous sulfate 325 (65 FE) MG tablet Take 1 tablet (325 mg total) by mouth 3 (three) times daily - between meals and at bedtime.   folic acid 1 MG tablet Commonly known as:  FOLVITE Take 1 tablet (1 mg total) by mouth daily.   omeprazole 20 MG capsule Commonly known as:  PRILOSEC Take 1 capsule (20 mg total) 2 (two) times daily before a meal by mouth.   prenatal multivitamin Tabs tablet Take 1 tablet by mouth daily at 12 noon.       Allergies:  Allergies  Allergen Reactions  . Ibuprofen Itching    Able to take advil brand ok  . Shellfish Allergy Hives and Itching    Past Medical History, Surgical  history, Social history, and Family History were reviewed and updated.  Review of Systems: All other 10 point review of systems is negative.   Physical Exam:  weight is 141 lb (64 kg). Her oral temperature is 98 F (36.7 C). Her blood pressure is 110/71 and her pulse is 79. Her respiration is 18 and oxygen saturation is 99%.   Wt Readings from Last 3 Encounters:  11/18/17 141 lb (64 kg)  11/11/17 138 lb 12.8 oz (63 kg)  10/28/17 136 lb (61.7 kg)    Ocular: Sclerae unicteric, pupils equal, round and reactive to light Ear-nose-throat: Oropharynx clear, dentition fair Lymphatic: No cervical, supraclavicular or axillary adenopathy Lungs no rales or rhonchi, good excursion bilaterally Heart regular rate and rhythm, no murmur appreciated Abd soft, nontender, positive bowel sounds, no liver or spleen tip palpated on exam, no fluid wave  MSK no focal spinal tenderness, no joint edema Neuro: non-focal, well-oriented, appropriate affect Breasts: Deferred   Lab Results  Component Value Date   WBC 15.2 (H) 11/18/2017   HGB 9.6 (L) 09/16/2017   HCT 31.3 (L) 11/18/2017   MCV 61.5 (L) 11/18/2017   PLT 381 11/18/2017   Lab Results  Component Value Date   FERRITIN 19 09/23/2017   IRON 54 09/23/2017  TIBC 554 (H) 09/23/2017   UIBC 500 09/23/2017   IRONPCTSAT 10 (L) 09/23/2017   Lab Results  Component Value Date   RETICCTPCT 2.9 (H) 02/16/2013   RBC 5.09 11/18/2017   RETICCTABS 135.4 02/16/2013   No results found for: KPAFRELGTCHN, LAMBDASER, KAPLAMBRATIO No results found for: IGGSERUM, IGA, IGMSERUM No results found for: Odetta Pink, SPEI   Chemistry      Component Value Date/Time   NA 139 11/18/2017 1504   NA 141 06/24/2017 1108   K 3.4 11/18/2017 1504   K 3.8 06/24/2017 1108   CL 104 11/18/2017 1504   CL 107 06/24/2017 1108   CO2 26 11/18/2017 1504   CO2 27 06/24/2017 1108   BUN 6 (L) 11/18/2017 1504   BUN 4 (L)  06/24/2017 1108   CREATININE 0.60 11/18/2017 1504   CREATININE 0.5 (L) 06/24/2017 1108      Component Value Date/Time   CALCIUM 9.2 11/18/2017 1504   CALCIUM 9.5 06/24/2017 1108   ALKPHOS 102 (H) 11/18/2017 1504   ALKPHOS 61 06/24/2017 1108   AST 18 11/18/2017 1504   ALT 11 11/18/2017 1504   ALT 22 06/24/2017 1108   BILITOT 0.6 11/18/2017 1504      Impression and Plan: Ms. Felipe is a very pleasant 32 yo [name] with Hgb E beta thalassemia. She continues to do well and has no complaints at this time. She is in her 3rd trimester and Due March 25th.  She will continue her folic acid daily.  We will plan to see her back in another 10 weeks or so.  She will contact our office with any questions or concerns. We can certainly see her sooner if need be.   Laverna Peace, NP 3/8/20194:09 PM

## 2017-11-21 ENCOUNTER — Ambulatory Visit (INDEPENDENT_AMBULATORY_CARE_PROVIDER_SITE_OTHER): Payer: Commercial Managed Care - PPO | Admitting: Obstetrics

## 2017-11-21 ENCOUNTER — Ambulatory Visit (HOSPITAL_COMMUNITY)
Admission: RE | Admit: 2017-11-21 | Discharge: 2017-11-21 | Disposition: A | Discharge status: Home / Self Care | Payer: Commercial Managed Care - PPO | Source: Ambulatory Visit | Attending: Obstetrics and Gynecology | Admitting: Obstetrics and Gynecology

## 2017-11-21 ENCOUNTER — Other Ambulatory Visit: Payer: Self-pay | Admitting: Obstetrics and Gynecology

## 2017-11-21 ENCOUNTER — Encounter: Payer: Self-pay | Admitting: Obstetrics

## 2017-11-21 VITALS — BP 107/71 | HR 93 | Wt 140.0 lb

## 2017-11-21 DIAGNOSIS — D565 Hemoglobin E-beta thalassemia: Secondary | ICD-10-CM | POA: Diagnosis not present

## 2017-11-21 DIAGNOSIS — Z363 Encounter for antenatal screening for malformations: Secondary | ICD-10-CM | POA: Insufficient documentation

## 2017-11-21 DIAGNOSIS — Z3480 Encounter for supervision of other normal pregnancy, unspecified trimester: Secondary | ICD-10-CM

## 2017-11-21 DIAGNOSIS — Z3689 Encounter for other specified antenatal screening: Secondary | ICD-10-CM | POA: Insufficient documentation

## 2017-11-21 DIAGNOSIS — Z362 Encounter for other antenatal screening follow-up: Secondary | ICD-10-CM

## 2017-11-21 DIAGNOSIS — O99210 Obesity complicating pregnancy, unspecified trimester: Secondary | ICD-10-CM

## 2017-11-21 DIAGNOSIS — Z3A38 38 weeks gestation of pregnancy: Secondary | ICD-10-CM

## 2017-11-21 DIAGNOSIS — O99013 Anemia complicating pregnancy, third trimester: Secondary | ICD-10-CM | POA: Insufficient documentation

## 2017-11-21 DIAGNOSIS — Z3483 Encounter for supervision of other normal pregnancy, third trimester: Secondary | ICD-10-CM

## 2017-11-21 LAB — IRON AND TIBC
Iron: 93 ug/dL (ref 41–142)
SATURATION RATIOS: 18 % — AB (ref 21–57)
TIBC: 530 ug/dL — AB (ref 236–444)
UIBC: 437 ug/dL

## 2017-11-21 LAB — FERRITIN: Ferritin: 62 ng/mL (ref 9–269)

## 2017-11-21 NOTE — Progress Notes (Signed)
Subjective:  Shari Murray is a 32 y.o. G3P2002 at [redacted]w[redacted]d being seen today for ongoing prenatal care.  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  Patient reports occasional contractions.  Contractions: Irregular. Vag. Bleeding: None.  Movement: Present. Denies leaking of fluid.   The following portions of the patient's history were reviewed and updated as appropriate: allergies, current medications, past family history, past medical history, past social history, past surgical history and problem list. Problem list updated.  Objective:   Vitals:   11/21/17 1455  BP: 107/71  Pulse: 93  Weight: 140 lb (63.5 kg)    Fetal Status: Fetal Heart Rate (bpm): 150   Movement: Present     General:  Alert, oriented and cooperative. Patient is in no acute distress.  Skin: Skin is warm and dry. No rash noted.   Cardiovascular: Normal heart rate noted  Respiratory: Normal respiratory effort, no problems with respiration noted  Abdomen: Soft, gravid, appropriate for gestational age. Pain/Pressure: Present     Pelvic:  Cervical exam deferred        Extremities: Normal range of motion.  Edema: Trace  Mental Status: Normal mood and affect. Normal behavior. Normal judgment and thought content.   Urinalysis:      Assessment and Plan:  Pregnancy: G3P2002 at [redacted]w[redacted]d  1. Supervision of other normal pregnancy, antepartum  2. 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  Stable.  Term labor symptoms and general obstetric precautions including but not limited to vaginal bleeding, contractions, leaking of fluid and fetal movement were reviewed in detail with the patient. Please refer to After Visit Summary for other counseling recommendations.  Return in about 1 week (around 11/28/2017) for [place].   Shelly Bombard,  MD

## 2017-11-23 ENCOUNTER — Other Ambulatory Visit: Payer: Self-pay | Admitting: Advanced Practice Midwife

## 2017-11-23 ENCOUNTER — Telehealth: Payer: Self-pay

## 2017-11-23 ENCOUNTER — Telehealth (HOSPITAL_COMMUNITY): Payer: Self-pay | Admitting: *Deleted

## 2017-11-23 ENCOUNTER — Encounter (HOSPITAL_COMMUNITY): Payer: Self-pay | Admitting: *Deleted

## 2017-11-23 NOTE — Telephone Encounter (Signed)
Pt aware after reviewing recent u/s indicates IUGR, IOL is scheduled 11/28/17 at 39 wks.

## 2017-11-23 NOTE — Telephone Encounter (Signed)
Preadmission screen  

## 2017-11-28 ENCOUNTER — Inpatient Hospital Stay (HOSPITAL_COMMUNITY)
Admission: AD | Admit: 2017-11-28 | Discharge: 2017-11-29 | DRG: 807 | Disposition: A | Discharge status: Home / Self Care | Payer: Commercial Managed Care - PPO | Source: Ambulatory Visit | Attending: Obstetrics & Gynecology | Admitting: Obstetrics & Gynecology

## 2017-11-28 ENCOUNTER — Encounter: Payer: Commercial Managed Care - PPO | Admitting: Family Medicine

## 2017-11-28 ENCOUNTER — Encounter (HOSPITAL_COMMUNITY): Payer: Self-pay

## 2017-11-28 ENCOUNTER — Inpatient Hospital Stay (HOSPITAL_COMMUNITY): Admission: RE | Admit: 2017-11-28 | Payer: Commercial Managed Care - PPO | Source: Ambulatory Visit

## 2017-11-28 ENCOUNTER — Inpatient Hospital Stay (HOSPITAL_COMMUNITY): Payer: Commercial Managed Care - PPO | Admitting: Anesthesiology

## 2017-11-28 DIAGNOSIS — Z3A39 39 weeks gestation of pregnancy: Secondary | ICD-10-CM

## 2017-11-28 DIAGNOSIS — O9902 Anemia complicating childbirth: Secondary | ICD-10-CM | POA: Diagnosis present

## 2017-11-28 DIAGNOSIS — O9962 Diseases of the digestive system complicating childbirth: Secondary | ICD-10-CM | POA: Diagnosis present

## 2017-11-28 DIAGNOSIS — O365930 Maternal care for other known or suspected poor fetal growth, third trimester, not applicable or unspecified: Secondary | ICD-10-CM | POA: Diagnosis present

## 2017-11-28 DIAGNOSIS — K219 Gastro-esophageal reflux disease without esophagitis: Secondary | ICD-10-CM | POA: Diagnosis present

## 2017-11-28 DIAGNOSIS — D509 Iron deficiency anemia, unspecified: Secondary | ICD-10-CM | POA: Diagnosis present

## 2017-11-28 DIAGNOSIS — F17210 Nicotine dependence, cigarettes, uncomplicated: Secondary | ICD-10-CM | POA: Diagnosis present

## 2017-11-28 DIAGNOSIS — O99334 Smoking (tobacco) complicating childbirth: Secondary | ICD-10-CM | POA: Diagnosis present

## 2017-11-28 DIAGNOSIS — Z3483 Encounter for supervision of other normal pregnancy, third trimester: Secondary | ICD-10-CM | POA: Diagnosis present

## 2017-11-28 DIAGNOSIS — D561 Beta thalassemia: Secondary | ICD-10-CM | POA: Diagnosis present

## 2017-11-28 DIAGNOSIS — Z3490 Encounter for supervision of normal pregnancy, unspecified, unspecified trimester: Secondary | ICD-10-CM

## 2017-11-28 LAB — CBC
HCT: 30.1 % — ABNORMAL LOW (ref 36.0–46.0)
Hemoglobin: 10.3 g/dL — ABNORMAL LOW (ref 12.0–15.0)
MCH: 20.8 pg — ABNORMAL LOW (ref 26.0–34.0)
MCHC: 34.2 g/dL (ref 30.0–36.0)
MCV: 60.7 fL — ABNORMAL LOW (ref 78.0–100.0)
PLATELETS: 366 10*3/uL (ref 150–400)
RBC: 4.96 MIL/uL (ref 3.87–5.11)
RDW: 19 % — AB (ref 11.5–15.5)
WBC: 22.9 10*3/uL — AB (ref 4.0–10.5)

## 2017-11-28 LAB — TYPE AND SCREEN
ABO/RH(D): O POS
ANTIBODY SCREEN: NEGATIVE

## 2017-11-28 LAB — RPR: RPR Ser Ql: NONREACTIVE

## 2017-11-28 MED ORDER — LACTATED RINGERS IV SOLN: 500.0000 mL | Freq: Once | INTRAVENOUS | Status: DC

## 2017-11-28 MED ORDER — ACETAMINOPHEN 325 MG PO TABS: 650.0000 mg | ORAL_TABLET | ORAL | Status: DC | PRN

## 2017-11-28 MED ORDER — FENTANYL CITRATE (PF) 100 MCG/2ML IJ SOLN: 100.0000 ug | INTRAMUSCULAR | Status: DC | PRN

## 2017-11-28 MED ORDER — COCONUT OIL OIL
1.0000 "application " | TOPICAL_OIL | Status: DC | PRN
  Filled 2017-11-28: qty 120

## 2017-11-28 MED ORDER — PHENYLEPHRINE 40 MCG/ML (10ML) SYRINGE FOR IV PUSH (FOR BLOOD PRESSURE SUPPORT)
80.0000 ug | PREFILLED_SYRINGE | INTRAVENOUS | Status: DC | PRN
  Filled 2017-11-28: qty 5

## 2017-11-28 MED ORDER — IBUPROFEN 600 MG PO TABS
600.0000 mg | ORAL_TABLET | Freq: Four times a day (QID) | ORAL | Status: DC
  Administered 2017-11-28 – 2017-11-29 (×3): 600 mg via ORAL
  Filled 2017-11-28 (×4): qty 1

## 2017-11-28 MED ORDER — TERBUTALINE SULFATE 1 MG/ML IJ SOLN
0.2500 mg | Freq: Once | INTRAMUSCULAR | Status: DC | PRN
  Filled 2017-11-28: qty 1

## 2017-11-28 MED ORDER — SENNOSIDES-DOCUSATE SODIUM 8.6-50 MG PO TABS
2.0000 | ORAL_TABLET | ORAL | Status: DC
  Administered 2017-11-28: 2 via ORAL
  Filled 2017-11-28: qty 2

## 2017-11-28 MED ORDER — LIDOCAINE HCL (PF) 1 % IJ SOLN
INTRAMUSCULAR | Status: DC | PRN
  Administered 2017-11-28 (×2): 4 mL via EPIDURAL

## 2017-11-28 MED ORDER — ZOLPIDEM TARTRATE 5 MG PO TABS: 5.0000 mg | ORAL_TABLET | Freq: Every evening | ORAL | Status: DC | PRN

## 2017-11-28 MED ORDER — TETANUS-DIPHTH-ACELL PERTUSSIS 5-2.5-18.5 LF-MCG/0.5 IM SUSP: 0.5000 mL | Freq: Once | INTRAMUSCULAR | Status: DC

## 2017-11-28 MED ORDER — OXYTOCIN 40 UNITS IN LACTATED RINGERS INFUSION - SIMPLE MED: 1.0000 m[IU]/min | INTRAVENOUS | Status: DC

## 2017-11-28 MED ORDER — ONDANSETRON HCL 4 MG/2ML IJ SOLN: 4.0000 mg | INTRAMUSCULAR | Status: DC | PRN

## 2017-11-28 MED ORDER — EPHEDRINE 5 MG/ML INJ
10.0000 mg | INTRAVENOUS | Status: DC | PRN
  Filled 2017-11-28: qty 2

## 2017-11-28 MED ORDER — BENZOCAINE-MENTHOL 20-0.5 % EX AERO
1.0000 "application " | INHALATION_SPRAY | CUTANEOUS | Status: DC | PRN
  Administered 2017-11-28: 1 via TOPICAL
  Filled 2017-11-28: qty 56

## 2017-11-28 MED ORDER — LACTATED RINGERS IV SOLN: 500.0000 mL | INTRAVENOUS | Status: DC | PRN

## 2017-11-28 MED ORDER — SOD CITRATE-CITRIC ACID 500-334 MG/5ML PO SOLN: 30.0000 mL | ORAL | Status: DC | PRN

## 2017-11-28 MED ORDER — PHENYLEPHRINE 40 MCG/ML (10ML) SYRINGE FOR IV PUSH (FOR BLOOD PRESSURE SUPPORT)
80.0000 ug | PREFILLED_SYRINGE | INTRAVENOUS | Status: DC | PRN
  Filled 2017-11-28: qty 5
  Filled 2017-11-28: qty 10

## 2017-11-28 MED ORDER — SIMETHICONE 80 MG PO CHEW
80.0000 mg | CHEWABLE_TABLET | ORAL | Status: DC | PRN
Start: 2017-11-28 — End: 2017-11-29

## 2017-11-28 MED ORDER — WITCH HAZEL-GLYCERIN EX PADS: 1.0000 "application " | MEDICATED_PAD | CUTANEOUS | Status: DC | PRN

## 2017-11-28 MED ORDER — OXYCODONE-ACETAMINOPHEN 5-325 MG PO TABS: 1.0000 | ORAL_TABLET | ORAL | Status: DC | PRN

## 2017-11-28 MED ORDER — FENTANYL 2.5 MCG/ML BUPIVACAINE 1/10 % EPIDURAL INFUSION (WH - ANES)
14.0000 mL/h | INTRAMUSCULAR | Status: DC | PRN
  Administered 2017-11-28: 11 mL/h via EPIDURAL
  Filled 2017-11-28: qty 100

## 2017-11-28 MED ORDER — FENTANYL CITRATE (PF) 100 MCG/2ML IJ SOLN
INTRAMUSCULAR | Status: AC
  Filled 2017-11-28: qty 2

## 2017-11-28 MED ORDER — DIBUCAINE 1 % RE OINT: 1.0000 "application " | TOPICAL_OINTMENT | RECTAL | Status: DC | PRN

## 2017-11-28 MED ORDER — ONDANSETRON HCL 4 MG PO TABS: 4.0000 mg | ORAL_TABLET | ORAL | Status: DC | PRN

## 2017-11-28 MED ORDER — ONDANSETRON HCL 4 MG/2ML IJ SOLN: 4.0000 mg | Freq: Four times a day (QID) | INTRAMUSCULAR | Status: DC | PRN

## 2017-11-28 MED ORDER — OXYCODONE-ACETAMINOPHEN 5-325 MG PO TABS: 2.0000 | ORAL_TABLET | ORAL | Status: DC | PRN

## 2017-11-28 MED ORDER — PRENATAL MULTIVITAMIN CH
1.0000 | ORAL_TABLET | Freq: Every day | ORAL | Status: DC
  Administered 2017-11-29: 1 via ORAL
  Filled 2017-11-28: qty 1

## 2017-11-28 MED ORDER — OXYTOCIN BOLUS FROM INFUSION
500.0000 mL | Freq: Once | INTRAVENOUS | Status: AC
  Administered 2017-11-28: 500 mL via INTRAVENOUS

## 2017-11-28 MED ORDER — DIPHENHYDRAMINE HCL 25 MG PO CAPS: 25.0000 mg | ORAL_CAPSULE | Freq: Four times a day (QID) | ORAL | Status: DC | PRN

## 2017-11-28 MED ORDER — LACTATED RINGERS IV SOLN: INTRAVENOUS | Status: DC

## 2017-11-28 MED ORDER — LACTATED RINGERS IV SOLN
500.0000 mL | Freq: Once | INTRAVENOUS | Status: AC
  Administered 2017-11-28: 500 mL via INTRAVENOUS

## 2017-11-28 MED ORDER — DIPHENHYDRAMINE HCL 50 MG/ML IJ SOLN: 12.5000 mg | INTRAMUSCULAR | Status: DC | PRN

## 2017-11-28 MED ORDER — ACETAMINOPHEN 325 MG PO TABS
650.0000 mg | ORAL_TABLET | ORAL | Status: DC | PRN
  Administered 2017-11-28: 650 mg via ORAL
  Filled 2017-11-28: qty 2

## 2017-11-28 MED ORDER — OXYTOCIN 40 UNITS IN LACTATED RINGERS INFUSION - SIMPLE MED
2.5000 [IU]/h | INTRAVENOUS | Status: DC
  Filled 2017-11-28: qty 1000

## 2017-11-28 MED ORDER — LIDOCAINE HCL (PF) 1 % IJ SOLN
30.0000 mL | INTRAMUSCULAR | Status: DC | PRN
  Filled 2017-11-28: qty 30

## 2017-11-28 NOTE — Progress Notes (Signed)
Labor Progress Note Kecia Swoboda is a 32 y.o. G3P2002 at [redacted]w[redacted]d presented for labor. S: No complaints  O:  BP 116/60   Pulse 82   Temp 98.8 F (37.1 C) (Oral)   Resp 19   Ht 4\' 9"  (1.448 m)   Wt 64.6 kg (142 lb 8 oz)   LMP 02/09/2017   SpO2 97%   BMI 30.84 kg/m  EFM: 140 bpm/mod var/no decels  CVE: Dilation: 9 Dilation Complete Date: 11/28/17 Dilation Complete Time: 0429 Effacement (%): 80 Cervical Position: Anterior Station: -1 Presentation: Vertex Exam by:: Dr. Manus Rudd   A&P: 32 y.o. [id number] [redacted]w[redacted]d here for labor. #Labor: Progressing well. AROM with clear fluid. Anticipate SVD. #Pain: epidural   Dannielle Huh, DO 6:32 AM

## 2017-11-28 NOTE — MAU Note (Signed)
CTX every 10 mins.  No LOF/VB.  Reports good FM.  No complications with the pregnancy.  Scheduled for induction at 0630. GBS neg

## 2017-11-28 NOTE — Lactation Note (Signed)
This note was copied from a baby's chart. Lactation Consultation Note  Patient Name: Shari Murray [id number] Date: 11/28/2017 Reason for consult: Initial assessment;Term  32 hours old FT female who is being exclusively BF by her mother, she's a P3. Mom is experienced BF, she BF her other children for 4-5 months. Baby was asleep when entering the room, per mom she just fed about an hour ago, LC updated last feeding. Offered assistance with latch but mom did not wish to disturb baby. Per mom feedings at the breast are comfortable and nipples intact without any cracks, blisters or bleeding.  Encouraged mom to feed baby STS 8-12 times/24 hours. Reviewed BF brochure, BF resources and feeding diary, mom is aware of [facility] services and will call PRN.  Maternal Data Formula Feeding for Exclusion: No Has patient been taught Hand Expression?: Yes Does the patient have breastfeeding experience prior to this delivery?: Yes  Feeding Feeding Type: Breast Fed Length of feed: 30 min   Interventions Interventions: Breast feeding basics reviewed  Lactation Tools Discussed/Used WIC Program: No   Consult Status Consult Status: PRN    Steffanie Mingle [place] 11/28/2017, 8:08 PM

## 2017-11-28 NOTE — Anesthesia Preprocedure Evaluation (Signed)
Anesthesia Evaluation  Patient identified by MRN, date of birth, ID band Patient awake    Reviewed: Allergy & Precautions, Patient's Chart, lab work & pertinent test results  Airway Mallampati: II  TM Distance: >3 FB Neck ROM: Full    Dental no notable dental hx. (+) Teeth Intact   Pulmonary Current Smoker,    Pulmonary exam normal breath sounds clear to auscultation       Cardiovascular negative cardio ROS Normal cardiovascular exam Rhythm:Regular Rate:Normal     Neuro/Psych  Headaches, negative psych ROS   GI/Hepatic Neg liver ROS, GERD  Controlled and Medicated,  Endo/Other  Obesity  Renal/GU negative Renal ROS  negative genitourinary   Musculoskeletal negative musculoskeletal ROS (+)   Abdominal (+) + obese,   Peds  Hematology  (+) anemia , Hb E beta thalassemia   Anesthesia Other Findings   Reproductive/Obstetrics (+) Pregnancy                             Anesthesia Physical Anesthesia Plan  ASA: II  Anesthesia Plan: Epidural   Post-op Pain Management:    Induction:   PONV Risk Score and Plan:   Airway Management Planned: Natural Airway  Additional Equipment:   Intra-op Plan:   Post-operative Plan:   Informed Consent: I have reviewed the patients History and Physical, chart, labs and discussed the procedure including the risks, benefits and alternatives for the proposed anesthesia with the patient or authorized representative who has indicated his/her understanding and acceptance.     Plan Discussed with: Anesthesiologist  Anesthesia Plan Comments:         Anesthesia Quick Evaluation

## 2017-11-28 NOTE — Anesthesia Postprocedure Evaluation (Signed)
Anesthesia Post Note  Patient: IT consultant  Procedure(s) Performed: AN AD HOC LABOR EPIDURAL     Patient location during evaluation: Mother Baby Anesthesia Type: Epidural Level of consciousness: awake and alert and oriented Pain management: satisfactory to patient Vital Signs Assessment: post-procedure vital signs reviewed and stable Respiratory status: spontaneous breathing and nonlabored ventilation Cardiovascular status: stable Postop Assessment: no headache, no backache, no signs of nausea or vomiting, adequate PO intake and patient able to bend at knees (patient up walking) Anesthetic complications: no    Last Vitals:  Vitals:   11/28/17 1020 11/28/17 1430  BP: (!) 93/45 (!) 104/58  Pulse: 77 71  Resp: 18 18  Temp: 36.7 C 37.1 C  SpO2:      Last Pain:  Vitals:   11/28/17 1752  TempSrc:   PainSc: 5    Pain Goal:                 Willa Rough

## 2017-11-28 NOTE — H&P (Signed)
Obstetric History and Physical  Jacquelene Kopecky is a 32 y.o. [id number] with IUP at [redacted]w[redacted]d presenting for contractions and early labor. She was scheduled for induction this morning at 6 am. Pregnancy complicated by stable Hgb E beta thalassemia. Baby has been SGA. [place] on 11/21/17 showed Est. FW:    2698  gm    5 lb 15 oz      21  % and AC 10%.  Prenatal Course Source of Care: [facility] Pregnancy complications or risks: Patient Active Problem List   Diagnosis Date Noted  . IDA (iron deficiency anemia) 09/27/2017  . 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 Supervision of other normal pregnancy, antepartum 04/27/2017   She plans to breastfeed She desires NFP for postpartum contraception.   Prenatal labs and studies: ABO, Rh: O/Positive/-- (08/15 1613) Antibody: Negative (08/15 1613) Rubella: 2.05 (08/15 1613) RPR: Non Reactive (01/04 1020)  HBsAg: Negative (08/15 1613)  HIV: Non Reactive (01/04 1020)  VOJ:JKKXFGHW (03/01 1006) 2 hr Glucola  normal Anatomy US normal  Past Medical History:  Diagnosis Date  . Anemia   . EXHBZJIR(678.9)     Past Surgical History:  Procedure Laterality Date  . NO PAST SURGERIES    . WISDOM TOOTH EXTRACTION  08/2016    OB History  Gravida Para Term Preterm AB Living  3 2 2     2   SAB TAB Ectopic Multiple Live Births          2    # Outcome Date GA Lbr Len/2nd Weight Sex Delivery Anes PTL Lv  3 Current           2 Term 04/09/13 [redacted]w[redacted]d 27:40 / 01:10 2.515 kg (5 lb 8.7 oz) F Vag-Spont EPI  LIV     Birth Comments: WNL  1 Term 03/15/04 [redacted]w[redacted]d 36:00 3.629 kg (8 lb) M Vag-Spont EPI N LIV     Birth Comments: Pt suffered with severe anemia during pregnancy      Social History   Socioeconomic History  . Marital status: Married    Spouse name: Isadore Bokhari  . Number of children: 1  . Years of education: None  . Highest education level: None  Social Needs  . Financial resource strain: None  . Food insecurity - worry: None  . Food insecurity -  inability: None  . Transportation needs - medical: None  . Transportation needs - non-medical: None  Occupational History  . Occupation: CNA    Employer: [employer]  Tobacco Use  . Smoking status: Current Every Day Smoker    Packs/day: 0.25    Types: Cigarettes    Start date: 12/10/1993  . Smokeless tobacco: Never Used  Substance and Sexual Activity  . Alcohol use: No  . Drug use: No  . Sexual activity: Not Currently    Birth control/protection: None  Other Topics Concern  . None  Social History Narrative  . None    Family History  Problem Relation Age of Onset  . Hypertension Father     Facility-Administered Medications Prior to Admission  Medication Dose Route Frequency Provider Last Rate Last Dose  . acetaminophen (TYLENOL) tablet 975 mg  975 mg Oral Once Copland, Gay Filler, MD       Medications Prior to Admission  Medication Sig Dispense Refill Last Dose  . Elastic Bandages & Supports (COMFORT FIT MATERNITY SUPP SM) MISC Wear as directed. 1 each 0 Taking  . ferrous sulfate 325 (65 FE) MG tablet Take 1 tablet (325 mg total)  by mouth 3 (three) times daily - between meals and at bedtime. 60 tablet 5 Taking  . folic acid (FOLVITE) 1 MG tablet Take 1 tablet (1 mg total) by mouth daily. 30 tablet 11 Taking  . omeprazole (PRILOSEC) 20 MG capsule Take 1 capsule (20 mg total) 2 (two) times daily before a meal by mouth. (Patient not taking: Reported on 11/11/2017) 60 capsule 5 Not Taking  . Prenatal Vit-Fe Fumarate-FA (PRENATAL MULTIVITAMIN) TABS Take 1 tablet by mouth daily at 12 noon.   Taking    Allergies  Allergen Reactions  . Ibuprofen Itching    Able to take advil brand ok  . Shellfish Allergy Hives and Itching    Review of Systems: Negative except for what is mentioned in HPI.  Physical Exam: BP 114/77   Pulse 83   Temp 98.5 F (36.9 C)   Resp 19   Ht 4\' 9"  (1.448 m)   Wt 64.6 kg (142 lb 8 oz)   LMP 02/09/2017   BMI 30.84 kg/m  CONSTITUTIONAL:  Well-developed, well-nourished female in no acute distress.  HENT:  Normocephalic, atraumatic, External right and left ear normal. Oropharynx is clear and moist EYES: Conjunctivae and EOM are normal. Pupils are equal, round, and reactive to light. No scleral icterus.  NECK: Normal range of motion, supple, no masses SKIN: Skin is warm and dry. No rash noted. Not diaphoretic. No erythema. No pallor. NEUROLOGIC: Alert and oriented to person, place, and time. Normal reflexes, muscle tone coordination. No cranial nerve deficit noted. PSYCHIATRIC: Normal mood and affect. Normal behavior. Normal judgment and thought content. CARDIOVASCULAR: Normal heart rate noted, regular rhythm RESPIRATORY: Effort and breath sounds normal, no problems with respiration noted ABDOMEN: Soft, nontender, nondistended, gravid. MUSCULOSKELETAL: Normal range of motion. No edema and no tenderness. 2+ distal pulses.  Cervical Exam: Dilatation 4cm   Effacement 80%   Station -2   Presentation: cephalic, verified by bedside US FHT:  Baseline rate 130 bpm   Variability moderate  Accelerations present   Decelerations none    Pertinent Labs/Studies:   No results found for this or any previous visit (from the past 24 hour(s)).  Assessment : Braeleigh Pyper is a 32 y.o. G3P2002 at [redacted]w[redacted]d being admitted early labor.  Plan: Labor: Induction/Augmentation as ordered as per protocol. Analgesia as needed. FWB: Reassuring fetal heart tracing.  GBS negative Delivery plan: Hopeful for vaginal delivery MOF: breast MOC: nexplanon  Dannielle Huh, DO

## 2017-11-28 NOTE — Anesthesia Procedure Notes (Signed)
Epidural Patient location during procedure: OB Start time: 11/28/2017 3:11 AM  Staffing Anesthesiologist: Josephine Igo, MD Performed: anesthesiologist   Preanesthetic Checklist Completed: patient identified, site marked, surgical consent, pre-op evaluation, timeout performed, IV checked, risks and benefits discussed and monitors and equipment checked  Epidural Patient position: sitting Prep: site prepped and draped and DuraPrep Patient monitoring: continuous pulse ox and blood pressure Approach: midline Location: L3-L4 Injection technique: LOR air  Needle:  Needle type: Tuohy  Needle gauge: 17 G Needle length: 9 cm and 9 Needle insertion depth: 4 cm Catheter type: closed end flexible Catheter size: 19 Gauge Catheter at skin depth: 9 cm Test dose: negative and Other  Assessment Events: blood not aspirated, injection not painful, no injection resistance, negative IV test and no paresthesia  Additional Notes Patient identified. Risks and benefits discussed including failed block, incomplete  Pain control, post dural puncture headache, nerve damage, paralysis, blood pressure Changes, nausea, vomiting, reactions to medications-both toxic and allergic and post Partum back pain. All questions were answered. Patient expressed understanding and wished to proceed. Sterile technique was used throughout procedure. Epidural site was Dressed with sterile barrier dressing. No paresthesias, signs of intravascular injection Or signs of intrathecal spread were encountered.  Patient was more comfortable after the epidural was dosed. Please see RN's note for documentation of vital signs and FHR which are stable.

## 2017-11-29 MED ORDER — IBUPROFEN 600 MG PO TABS: 600.0000 mg | ORAL_TABLET | Freq: Four times a day (QID) | ORAL | 0 refills | Status: DC

## 2017-11-29 NOTE — Discharge Instructions (Signed)
Vaginal Delivery, Care After °Refer to this sheet in the next few weeks. These instructions provide you with information about caring for yourself after vaginal delivery. Your health care provider may also give you more specific instructions. Your treatment has been planned according to current medical practices, but problems sometimes occur. Call your health care provider if you have any problems or questions. °What can I expect after the procedure? °After vaginal delivery, it is common to have: °· Some bleeding from your vagina. °· Soreness in your abdomen, your vagina, and the area of skin between your vaginal opening and your anus (perineum). °· Pelvic cramps. °· Fatigue. ° °Follow these instructions at home: °Medicines °· Take over-the-counter and prescription medicines only as told by your health care provider. °· If you were prescribed an antibiotic medicine, take it as told by your health care provider. Do not stop taking the antibiotic until it is finished. °Driving ° °· Do not drive or operate heavy machinery while taking prescription pain medicine. °· Do not drive for 24 hours if you received a sedative. °Lifestyle °· Do not drink alcohol. This is especially important if you are breastfeeding or taking medicine to relieve pain. °· Do not use tobacco products, including cigarettes, chewing tobacco, or e-cigarettes. If you need help quitting, ask your health care provider. °Eating and drinking °· Drink at least 8 eight-ounce glasses of water every day unless you are told not to by your health care provider. If you choose to breastfeed your baby, you may need to drink more water than this. °· Eat high-fiber foods every day. These foods may help prevent or relieve constipation. High-fiber foods include: °? Whole grain cereals and breads. °? Brown rice. °? Beans. °? Fresh fruits and vegetables. °Activity °· Return to your normal activities as told by your health care provider. Ask your health care provider  what activities are safe for you. °· Rest as much as possible. Try to rest or take a nap when your baby is sleeping. °· Do not lift anything that is heavier than your baby or 10 lb (4.5 kg) until your health care provider says that it is safe. °· Talk with your health care provider about when you can engage in sexual activity. This may depend on your: °? Risk of infection. °? Rate of healing. °? Comfort and desire to engage in sexual activity. °Vaginal Care °· If you have an episiotomy or a vaginal tear, check the area every day for signs of infection. Check for: °? More redness, swelling, or pain. °? More fluid or blood. °? Warmth. °? Pus or a bad smell. °· Do not use tampons or douches until your health care provider says this is safe. °· Watch for any blood clots that may pass from your vagina. These may look like clumps of dark red, brown, or black discharge. °General instructions °· Keep your perineum clean and dry as told by your health care provider. °· Wear loose, comfortable clothing. °· Wipe from front to back when you use the toilet. °· Ask your health care provider if you can shower or take a bath. If you had an episiotomy or a perineal tear during labor and delivery, your health care provider may tell you not to take baths for a certain length of time. °· Wear a bra that supports your breasts and fits you well. °· If possible, have someone help you with household activities and help care for your baby for at least a few days after   you leave the hospital. °· Keep all follow-up visits for you and your baby as told by your health care provider. This is important. °Contact a health care provider if: °· You have: °? Vaginal discharge that has a bad smell. °? Difficulty urinating. °? Pain when urinating. °? A sudden increase or decrease in the frequency of your bowel movements. °? More redness, swelling, or pain around your episiotomy or vaginal tear. °? More fluid or blood coming from your episiotomy or  vaginal tear. °? Pus or a bad smell coming from your episiotomy or vaginal tear. °? A fever. °? A rash. °? Little or no interest in activities you used to enjoy. °? Questions about caring for yourself or your baby. °· Your episiotomy or vaginal tear feels warm to the touch. °· Your episiotomy or vaginal tear is separating or does not appear to be healing. °· Your breasts are painful, hard, or turn red. °· You feel unusually sad or worried. °· You feel nauseous or you vomit. °· You pass large blood clots from your vagina. If you pass a blood clot from your vagina, save it to show to your health care provider. Do not flush blood clots down the toilet without having your health care provider look at them. °· You urinate more than usual. °· You are dizzy or light-headed. °· You have not breastfed at all and you have not had a menstrual period for 12 weeks after delivery. °· You have stopped breastfeeding and you have not had a menstrual period for 12 weeks after you stopped breastfeeding. °Get help right away if: °· You have: °? Pain that does not go away or does not get better with medicine. °? Chest pain. °? Difficulty breathing. °? Blurred vision or spots in your vision. °? Thoughts about hurting yourself or your baby. °· You develop pain in your abdomen or in one of your legs. °· You develop a severe headache. °· You faint. °· You bleed from your vagina so much that you fill two sanitary pads in one hour. °This information is not intended to replace advice given to you by your health care provider. Make sure you discuss any questions you have with your health care provider. °Document Released: 08/27/2000 Document Revised: 02/11/2016 Document Reviewed: 09/14/2015 °Elsevier Interactive Patient Education © 2018 [employer] ° °

## 2017-11-29 NOTE — Discharge Summary (Signed)
OB Discharge Summary    Patient Name: Shari Murray DOB: December 09, 1985 MRN: [id number] Date of admission: 11/28/2017  Delivering MD: Dannielle Huh )  Date of discharge: 11/29/2017  Admitting diagnosis: early labor Intrauterine pregnancy: [redacted]w[redacted]d    Secondary diagnosis:  Active Problems:   Patient Active Problem List   Diagnosis Date Noted  . Pregnancy 11/28/2017  . SVD (spontaneous vaginal delivery) 11/28/2017  . IDA (iron deficiency anemia) 09/27/2017  . 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 Supervision of other normal pregnancy, antepartum 04/27/2017    Additional problems: Hgb E-beta thalassemia, IDA     Discharge diagnosis: Term Pregnancy Delivered and chronic anemia                                                                               Post partum procedures:n/a  Complications: None  Hospital course:  Onset of Labor With Vaginal Delivery     32 y.o. yo G3P3003 at [redacted]w[redacted]d was admitted in Latent Labor on 11/28/2017. Patient had an uncomplicated labor course as follows:  Membrane Rupture Time/Date: 5:26 AM ,11/28/2017   Intrapartum Procedures: Episiotomy: None [1]                                         Lacerations:  None [1]  Patient had a delivery of a Viable infant. 11/28/2017  Information for the patient's newborn:  Anaiz, Qazi [[id number]]  Delivery Method: Vaginal, Spontaneous(Filed from Delivery Summary)   Pateint had an uncomplicated postpartum course.  She is ambulating, tolerating a regular diet, passing flatus, and urinating well. Patient is discharged home in stable condition on 11/29/17.   Physical exam  Vitals:   11/28/17 2322 11/29/17 0611  BP: (!) 103/53 105/70  Pulse: 72 78  Resp: 18 16  Temp: 97.8 F (36.6 C) 97.6 F (36.4 C)  SpO2:      General: alert, cooperative and no distress Lochia: appropriate Uterine Fundus: firm Incision: N/A DVT Evaluation: No evidence of DVT seen on physical exam.  Labs: No results found for this  or any previous visit (from the past 24 hour(s)).   Discharge instruction: per After Visit Summary and "Baby and Me Booklet".  After visit meds:  Allergies  Allergen Reactions  . Shellfish Allergy Hives and Itching    Allergies as of 11/29/2017      Reactions   Shellfish Allergy Hives, Itching      Medication List    TAKE these medications   COMFORT FIT MATERNITY SUPP SM Misc Wear as directed.   ferrous sulfate 325 (65 FE) MG tablet Take 1 tablet (325 mg total) by mouth 3 (three) times daily - between meals and at bedtime.   folic acid 1 MG tablet Commonly known as:  FOLVITE Take 1 tablet (1 mg total) by mouth daily.   ibuprofen 600 MG tablet Commonly known as:  ADVIL,MOTRIN Take 1 tablet (600 mg total) by mouth every 6 (six) hours.   omeprazole 20 MG capsule Commonly known as:  PRILOSEC Take 1 capsule (20 mg total) 2 (two) times daily before a meal by mouth.  prenatal multivitamin Tabs tablet Take 1 tablet by mouth daily at 12 noon.         Diet: routine diet  Activity: Advance as tolerated. Pelvic rest for 6 weeks.   Outpatient follow up:4-6wks Future Appointments:  Future Appointments  Date Time Provider [place]  12/26/2017 10:00 AM Shelly Bombard, MD [place] None  02/01/2018 10:45 AM [facility] None  02/01/2018 11:15 AM [place], Holli Humbles, NP [facility] None    Follow up Appt: No Follow-up on file.  Postpartum contraception: Natural Family Planning; declines any other birth control.   Newborn Data: APGAR (1 MIN): 8   APGAR (5 MINS): 9    Baby Feeding: Breast Disposition:home with mother  Gailen Shelter, MD  11/29/2017

## 2017-12-26 ENCOUNTER — Ambulatory Visit (INDEPENDENT_AMBULATORY_CARE_PROVIDER_SITE_OTHER): Payer: Commercial Managed Care - PPO | Admitting: Obstetrics

## 2017-12-26 ENCOUNTER — Encounter: Payer: Self-pay | Admitting: Obstetrics

## 2017-12-26 DIAGNOSIS — Z3009 Encounter for other general counseling and advice on contraception: Secondary | ICD-10-CM

## 2017-12-26 DIAGNOSIS — D508 Other iron deficiency anemias: Secondary | ICD-10-CM

## 2017-12-26 NOTE — Progress Notes (Signed)
..  Post Partum Exam  Shari Murray is a 32 y.o. G13P3003 female who presents for a postpartum visit. She is 4 weeks postpartum following a {delivery:[id number]. I have fully reviewed the prenatal and intrapartum course. The delivery was at 86 gestational weeks.  Anesthesia: Epidural. Postpartum course has been good. Baby's course has been good. Baby is feeding by breast and bottle. Bleeding is spotting. Bowel function is normal. Bladder function is normal. Patient is not sexually active. Contraception method is codom . Postpartum depression screening:neg  The following portions of the patient's history were reviewed and updated as appropriate: allergies, current medications, past family history, past medical history, past social history, past surgical history and problem list. Last pap smear done 2019 and was Normal  Review of Systems A comprehensive review of systems was negative.    Objectiv:  Blood pressure 120/79, pulse 79, resp. rate 16, weight 120 lb 9.6 oz (54.7 kg), currently breastfeeding.  General:  alert and no distress   Breasts:  inspection negative, no nipple discharge or bleeding, no masses or nodularity palpable  Lungs: clear to auscultation bilaterally  Heart:  regular rate and rhythm, S1, S2 normal, no murmur, click, rub or gallop  Abdomen: normal findings: no masses palpable, no organomegaly and soft, non-tender   Assessment:    1. Postpartum care following vaginal delivery - doing well.  Breast feeding.  2. Encounter for other general counseling or advice on contraception - declines contraception  3. Iron deficiency anemia secondary to inadequate dietary iron intake - managed by PCP  Plan:   1. Contraception: rhythm method 2. Continue PNV's 3. Follow up in: 6 weeks or as needed.    Shelly Bombard MD 12-26-2017

## 2018-02-01 ENCOUNTER — Inpatient Hospital Stay: Payer: Commercial Managed Care - PPO

## 2018-02-01 ENCOUNTER — Encounter: Payer: Self-pay | Admitting: Family

## 2018-02-01 ENCOUNTER — Telehealth: Payer: Self-pay | Admitting: *Deleted

## 2018-02-01 ENCOUNTER — Other Ambulatory Visit: Payer: Self-pay

## 2018-02-01 ENCOUNTER — Inpatient Hospital Stay: Payer: Commercial Managed Care - PPO | Attending: Family | Admitting: Family

## 2018-02-01 VITALS — BP 133/80 | HR 57 | Temp 98.2°F | Resp 20 | Wt 119.0 lb

## 2018-02-01 DIAGNOSIS — R5383 Other fatigue: Secondary | ICD-10-CM | POA: Diagnosis not present

## 2018-02-01 DIAGNOSIS — D565 Hemoglobin E-beta thalassemia: Secondary | ICD-10-CM

## 2018-02-01 DIAGNOSIS — Z79899 Other long term (current) drug therapy: Secondary | ICD-10-CM | POA: Diagnosis not present

## 2018-02-01 DIAGNOSIS — D561 Beta thalassemia: Secondary | ICD-10-CM | POA: Diagnosis not present

## 2018-02-01 DIAGNOSIS — R42 Dizziness and giddiness: Secondary | ICD-10-CM | POA: Insufficient documentation

## 2018-02-01 DIAGNOSIS — D508 Other iron deficiency anemias: Secondary | ICD-10-CM

## 2018-02-01 LAB — CBC WITH DIFFERENTIAL (CANCER CENTER ONLY)
BASOS ABS: 0.1 10*3/uL (ref 0.0–0.1)
BASOS PCT: 0 %
Eosinophils Absolute: 0.4 10*3/uL (ref 0.0–0.5)
Eosinophils Relative: 3 %
HEMATOCRIT: 36.8 % (ref 34.8–46.6)
HEMOGLOBIN: 13.1 g/dL (ref 11.6–15.9)
LYMPHS PCT: 25 %
Lymphs Abs: 3.5 10*3/uL — ABNORMAL HIGH (ref 0.9–3.3)
MCH: 19.8 pg — ABNORMAL LOW (ref 26.0–34.0)
MCHC: 35.6 g/dL (ref 32.0–36.0)
MCV: 55.7 fL — AB (ref 81.0–101.0)
Monocytes Absolute: 0.8 10*3/uL (ref 0.1–0.9)
Monocytes Relative: 6 %
NEUTROS PCT: 66 %
Neutro Abs: 9.4 10*3/uL — ABNORMAL HIGH (ref 1.5–6.5)
Platelet Count: 450 10*3/uL — ABNORMAL HIGH (ref 145–400)
RBC: 6.61 MIL/uL — ABNORMAL HIGH (ref 3.70–5.32)
RDW: 19.1 % — ABNORMAL HIGH (ref 11.1–15.7)
WBC Count: 14.2 10*3/uL — ABNORMAL HIGH (ref 3.9–10.0)

## 2018-02-01 LAB — CMP (CANCER CENTER ONLY)
ALBUMIN: 4.4 g/dL (ref 3.5–5.0)
ALT: 54 U/L (ref 0–55)
AST: 35 U/L — AB (ref 5–34)
Alkaline Phosphatase: 125 U/L (ref 40–150)
Anion gap: 10 (ref 3–11)
BILIRUBIN TOTAL: 0.7 mg/dL (ref 0.2–1.2)
BUN: 8 mg/dL (ref 7–26)
CHLORIDE: 100 mmol/L (ref 98–109)
CO2: 32 mmol/L — ABNORMAL HIGH (ref 22–29)
CREATININE: 0.71 mg/dL (ref 0.60–1.10)
Calcium: 9.4 mg/dL (ref 8.4–10.4)
GFR, Est AFR Am: >60 mL/min (ref >60)
GLUCOSE: 113 mg/dL (ref 70–140)
Potassium: 3 mmol/L — CL (ref 3.5–5.1)
Sodium: 142 mmol/L (ref 136–145)
TOTAL PROTEIN: 7.9 g/dL (ref 6.4–8.3)

## 2018-02-01 NOTE — Progress Notes (Signed)
Hematology and Oncology Follow Up Visit  Shari Murray [id number] Feb 17, 1986 32 y.o. 02/01/2018   Principle Diagnosis:  Hgb E - beta thalassemia  Current Therapy:   Folic acid 1 mg PO daily   Interim History: Shari Murray is here today for follow-up. She is doing well and states that she had a healthy baby girl. She is breast feeding and has had no issue with bleeding. No bruising or petechiae.  She has not had a cycle.  Hgb is stable at 13.1, MCV 55, platelet count is 450. She has been feeling fatigued and has mild dizziness if she stands too quickly.  No fever, chills, n/v, cough, rash, SOB, chest pain, palpitations, abdominal pain or changes in bowel or bladder habits.  No swelling or tenderness in her extremities. She has tingling in her arms and legs that comes and goes.  No lymphadenopathy noted on exam.  She has a good appetite and is staying well hydrated. Her weight is stable.   ECOG Performance Status: 1 - Symptomatic but completely ambulatory  Medications:  Allergies as of 02/01/2018      Reactions   Shellfish Allergy Hives, Itching      Medication List        Accurate as of 02/01/18 10:48 AM. Always use your most recent med list.          ferrous sulfate 325 (65 FE) MG tablet Take 1 tablet (325 mg total) by mouth 3 (three) times daily - between meals and at bedtime.   folic acid 1 MG tablet Commonly known as:  FOLVITE Take 1 tablet (1 mg total) by mouth daily.   ibuprofen 600 MG tablet Commonly known as:  ADVIL,MOTRIN Take 1 tablet (600 mg total) by mouth every 6 (six) hours.   omeprazole 20 MG capsule Commonly known as:  PRILOSEC Take 1 capsule (20 mg total) 2 (two) times daily before a meal by mouth.   prenatal multivitamin Tabs tablet Take 1 tablet by mouth daily at 12 noon.       Allergies:  Allergies  Allergen Reactions  . Shellfish Allergy Hives and Itching    Past Medical History, Surgical history, Social history, and Family  History were reviewed and updated.  Review of Systems: All other 10 point review of systems is negative.   Physical Exam:  vitals were not taken for this visit.   Wt Readings from Last 3 Encounters:  12/26/17 120 lb 9.6 oz (54.7 kg)  11/28/17 142 lb 8 oz (64.6 kg)  11/21/17 140 lb (63.5 kg)    Ocular: Sclerae unicteric, pupils equal, round and reactive to light Ear-nose-throat: Oropharynx clear, dentition fair Lymphatic: No cervical, supraclavicular or axillary adenopathy Lungs no rales or rhonchi, good excursion bilaterally Heart regular rate and rhythm, no murmur appreciated Abd soft, nontender, positive bowel sounds, no liver or spleen tip palpated on exam, no fluid wave  MSK no focal spinal tenderness, no joint edema Neuro: non-focal, well-oriented, appropriate affect Breasts: Deferred   Lab Results  Component Value Date   WBC 22.9 (H) 11/28/2017   HGB 10.3 (L) 11/28/2017   HCT 30.1 (L) 11/28/2017   MCV 60.7 (L) 11/28/2017   PLT 366 11/28/2017   Lab Results  Component Value Date   FERRITIN 62 11/18/2017   IRON 93 11/18/2017   TIBC 530 (H) 11/18/2017   UIBC 437 11/18/2017   IRONPCTSAT 18 (L) 11/18/2017   Lab Results  Component Value Date   RETICCTPCT 2.9 (H) 02/16/2013   RBC 4.96  11/28/2017   RETICCTABS 135.4 02/16/2013   No results found for: KPAFRELGTCHN, LAMBDASER, KAPLAMBRATIO No results found for: IGGSERUM, IGA, IGMSERUM No results found for: Odetta Pink, SPEI   Chemistry      Component Value Date/Time   NA 139 11/18/2017 1504   NA 141 06/24/2017 1108   K 3.4 11/18/2017 1504   K 3.8 06/24/2017 1108   CL 104 11/18/2017 1504   CL 107 06/24/2017 1108   CO2 26 11/18/2017 1504   CO2 27 06/24/2017 1108   BUN 6 (L) 11/18/2017 1504   BUN 4 (L) 06/24/2017 1108   CREATININE 0.60 11/18/2017 1504   CREATININE 0.5 (L) 06/24/2017 1108      Component Value Date/Time   CALCIUM 9.2 11/18/2017 1504    CALCIUM 9.5 06/24/2017 1108   ALKPHOS 102 (H) 11/18/2017 1504   ALKPHOS 61 06/24/2017 1108   AST 18 11/18/2017 1504   ALT 11 11/18/2017 1504   ALT 22 06/24/2017 1108   BILITOT 0.6 11/18/2017 1504      Impression and Plan: Shari Murray is a very pleasant 32 yo [name] with Hgb E beta thalassemia. She is doing well but is symptomatic with fatigue and mild dizziness when she stands too quickly.  We will see what her iron studies show and bring her back in for infusion if needed.  She will restart her folic acid daily.  Her potassium was low at 3.0, I forwarded this to her PCP for further recommendation where she is breast feeding.  We will plan to see her back in another 3 months for follow-up.  She will contact our office with any questions or concerns. We can certainly see her sooner if need be.   Laverna Peace, NP 5/22/201910:48 AM

## 2018-02-01 NOTE — Telephone Encounter (Signed)
Critical Value Potassium 3.0 Sarah [place] NP notified. No orders at this time 

## 2018-02-02 LAB — IRON AND TIBC
Iron: 44 ug/dL (ref 41–142)
Saturation Ratios: 14 % — ABNORMAL LOW (ref 21–57)
TIBC: 303 ug/dL (ref 236–444)
UIBC: 259 ug/dL

## 2018-02-02 LAB — FERRITIN: Ferritin: 92 ng/mL (ref 9–269)

## 2018-02-07 ENCOUNTER — Ambulatory Visit: Payer: Commercial Managed Care - PPO | Admitting: Obstetrics

## 2018-02-17 ENCOUNTER — Telehealth: Payer: Self-pay | Admitting: Hematology & Oncology

## 2018-02-17 NOTE — Telephone Encounter (Signed)
SW PT RE TO SCHEDULE IV IRON APPT PER SCHE MESSAGE. PT DECLINED APPT DUE TO THE WAY IT MAKES HER FEEL BAD

## 2018-05-03 ENCOUNTER — Encounter: Payer: Self-pay | Admitting: Family

## 2018-05-03 ENCOUNTER — Inpatient Hospital Stay: Payer: Commercial Managed Care - PPO

## 2018-05-03 ENCOUNTER — Other Ambulatory Visit: Payer: Self-pay

## 2018-05-03 ENCOUNTER — Inpatient Hospital Stay: Payer: Commercial Managed Care - PPO | Attending: Family | Admitting: Family

## 2018-05-03 VITALS — BP 103/62 | HR 70 | Temp 98.5°F | Resp 20 | Wt 125.0 lb

## 2018-05-03 DIAGNOSIS — K219 Gastro-esophageal reflux disease without esophagitis: Secondary | ICD-10-CM

## 2018-05-03 DIAGNOSIS — D508 Other iron deficiency anemias: Secondary | ICD-10-CM

## 2018-05-03 DIAGNOSIS — D565 Hemoglobin E-beta thalassemia: Secondary | ICD-10-CM

## 2018-05-03 DIAGNOSIS — D509 Iron deficiency anemia, unspecified: Secondary | ICD-10-CM

## 2018-05-03 DIAGNOSIS — D561 Beta thalassemia: Secondary | ICD-10-CM | POA: Diagnosis present

## 2018-05-03 DIAGNOSIS — Z79899 Other long term (current) drug therapy: Secondary | ICD-10-CM | POA: Insufficient documentation

## 2018-05-03 LAB — CMP (CANCER CENTER ONLY)
ALT: 55 U/L — ABNORMAL HIGH (ref 10–47)
ANION GAP: 4 — AB (ref 5–15)
AST: 28 U/L (ref 11–38)
Albumin: 3.6 g/dL (ref 3.5–5.0)
Alkaline Phosphatase: 83 U/L (ref 26–84)
BILIRUBIN TOTAL: 0.7 mg/dL (ref 0.2–1.6)
BUN: 6 mg/dL — ABNORMAL LOW (ref 7–22)
CALCIUM: 9 mg/dL (ref 8.0–10.3)
CO2: 28 mmol/L (ref 18–33)
Chloride: 106 mmol/L (ref 98–108)
Creatinine: 0.8 mg/dL (ref 0.60–1.20)
Glucose, Bld: 122 mg/dL — ABNORMAL HIGH (ref 73–118)
Potassium: 3.6 mmol/L (ref 3.3–4.7)
Sodium: 138 mmol/L (ref 128–145)
Total Protein: 7 g/dL (ref 6.4–8.1)

## 2018-05-03 LAB — CBC WITH DIFFERENTIAL (CANCER CENTER ONLY)
BASOS ABS: 0.1 10*3/uL (ref 0.0–0.1)
Basophils Relative: 0 %
Eosinophils Absolute: 0.3 10*3/uL (ref 0.0–0.5)
Eosinophils Relative: 2 %
HEMATOCRIT: 33 % — AB (ref 34.8–46.6)
Hemoglobin: 11.4 g/dL — ABNORMAL LOW (ref 11.6–15.9)
LYMPHS ABS: 4.5 10*3/uL — AB (ref 0.9–3.3)
LYMPHS PCT: 28 %
MCH: 20.4 pg — AB (ref 26.0–34.0)
MCHC: 34.5 g/dL (ref 32.0–36.0)
MCV: 59 fL — AB (ref 81.0–101.0)
MONO ABS: 1 10*3/uL — AB (ref 0.1–0.9)
MONOS PCT: 6 %
NEUTROS ABS: 10.3 10*3/uL — AB (ref 1.5–6.5)
Neutrophils Relative %: 64 %
Platelet Count: 371 10*3/uL (ref 145–400)
RBC: 5.59 MIL/uL — ABNORMAL HIGH (ref 3.70–5.32)
RDW: 17.6 % — AB (ref 11.1–15.7)
WBC Count: 16.2 10*3/uL — ABNORMAL HIGH (ref 3.9–10.0)

## 2018-05-03 NOTE — Progress Notes (Signed)
Hematology and Oncology Follow Up Visit  Shari Murray [id number] Nov 04, 1985 32 y.o. 05/03/2018   Principle Diagnosis:  Hgb E - beta thalassemia Iron deficiency anemia   Current Therapy:   Folic acid 1 mg PO daily IV iron as indicated - last received in January 2019    Interim History: Shari Murray is here today with her son and daughter for follow-up. She is doing well and feels like her symptoms are improving. The numbness and tingling in her extremities has resolved. Her energy is better and she does not chew ice.  She stopped breast feeding and her cycle has started back. It is irregular but quite heavy.  She has not been taking her iron and folic acid supplements regularly. Hgb is 11.4 with MCV 59.  No fever, chills, n/v, cough, rash, dizziness, SOB, chest pain, palpitations, abdominal pain or changes in bowel or bladder habits.  She sometimes has GERD with spicy foods.  No swelling or tenderness in her extremities. No c/o pain.  No lymphadenopathy noted on exam.  She has a good appetite and is staying well hydrated. Her weight is stable.   ECOG Performance Status: 1 - Symptomatic but completely ambulatory  Medications:  Allergies as of 05/03/2018      Reactions   Shellfish Allergy Hives, Itching      Medication List        Accurate as of 05/03/18  3:28 PM. Always use your most recent med list.          ferrous sulfate 325 (65 FE) MG tablet Take 1 tablet (325 mg total) by mouth 3 (three) times daily - between meals and at bedtime.   folic acid 1 MG tablet Commonly known as:  FOLVITE Take 1 tablet (1 mg total) by mouth daily.   ibuprofen 600 MG tablet Commonly known as:  ADVIL,MOTRIN Take 1 tablet (600 mg total) by mouth every 6 (six) hours.   omeprazole 20 MG capsule Commonly known as:  PRILOSEC Take 1 capsule (20 mg total) 2 (two) times daily before a meal by mouth.   prenatal multivitamin Tabs tablet Take 1 tablet by mouth daily at 12 noon.         Allergies:  Allergies  Allergen Reactions  . Shellfish Allergy Hives and Itching    Past Medical History, Surgical history, Social history, and Family History were reviewed and updated.  Review of Systems: All other 10 point review of systems is negative.   Physical Exam:  vitals were not taken for this visit.   Wt Readings from Last 3 Encounters:  02/01/18 119 lb (54 kg)  12/26/17 120 lb 9.6 oz (54.7 kg)  11/28/17 142 lb 8 oz (64.6 kg)    Ocular: Sclerae unicteric, pupils equal, round and reactive to light Ear-nose-throat: Oropharynx clear, dentition fair Lymphatic: No cervical, supraclavicular or axillary adenopathy Lungs no rales or rhonchi, good excursion bilaterally Heart regular rate and rhythm, no murmur appreciated Abd soft, nontender, positive bowel sounds, no liver or spleen tip palpated on exam, no fluid wave  MSK no focal spinal tenderness, no joint edema Neuro: non-focal, well-oriented, appropriate affect Breasts: Deferred   Lab Results  Component Value Date   WBC 16.2 (H) 05/03/2018   HGB 11.4 (L) 05/03/2018   HCT 33.0 (L) 05/03/2018   MCV 59.0 (L) 05/03/2018   PLT 371 05/03/2018   Lab Results  Component Value Date   FERRITIN 92 02/01/2018   IRON 44 02/01/2018   TIBC 303 02/01/2018   UIBC  259 02/01/2018   IRONPCTSAT 14 (L) 02/01/2018   Lab Results  Component Value Date   RETICCTPCT 2.9 (H) 02/16/2013   RBC 5.59 (H) 05/03/2018   RETICCTABS 135.4 02/16/2013   No results found for: KPAFRELGTCHN, LAMBDASER, KAPLAMBRATIO No results found for: IGGSERUM, IGA, IGMSERUM No results found for: Odetta Pink, SPEI   Chemistry      Component Value Date/Time   NA 142 02/01/2018 1039   NA 141 06/24/2017 1108   K 3.0 (LL) 02/01/2018 1039   K 3.8 06/24/2017 1108   CL 100 02/01/2018 1039   CL 107 06/24/2017 1108   CO2 32 (H) 02/01/2018 1039   CO2 27 06/24/2017 1108   BUN 8 02/01/2018 1039   BUN 4  (L) 06/24/2017 1108   CREATININE 0.71 02/01/2018 1039   CREATININE 0.5 (L) 06/24/2017 1108      Component Value Date/Time   CALCIUM 9.4 02/01/2018 1039   CALCIUM 9.5 06/24/2017 1108   ALKPHOS 125 02/01/2018 1039   ALKPHOS 61 06/24/2017 1108   AST 35 (H) 02/01/2018 1039   ALT 54 02/01/2018 1039   ALT 22 06/24/2017 1108   BILITOT 0.7 02/01/2018 1039      Impression and Plan: Shari Murray is a very pleasant 32 yo [name] with Hgb E beta thalassemia as well as iron deficiency anemia.  She will start taking her oral iron and folic acid supplements regularly. She does not want to have another IV iron infusion if she can help it.  We will go ahead and plan to see her back in another 3 months for follow-up.  She will contact our office with any questions or concerns. We can certainly see her sooner if need be.   Laverna Peace, NP 8/21/20193:28 PM

## 2018-05-04 LAB — IRON AND TIBC
IRON: 117 ug/dL (ref 41–142)
SATURATION RATIOS: 37 % (ref 21–57)
TIBC: 319 ug/dL (ref 236–444)
UIBC: 202 ug/dL

## 2018-05-04 LAB — FERRITIN: Ferritin: 117 ng/mL (ref 11–307)

## 2018-08-02 ENCOUNTER — Inpatient Hospital Stay: Payer: Commercial Managed Care - PPO | Attending: Family | Admitting: Hematology & Oncology

## 2018-08-02 ENCOUNTER — Encounter: Payer: Self-pay | Admitting: Hematology & Oncology

## 2018-08-02 ENCOUNTER — Other Ambulatory Visit: Payer: Self-pay

## 2018-08-02 ENCOUNTER — Inpatient Hospital Stay: Payer: Commercial Managed Care - PPO

## 2018-08-02 VITALS — BP 112/64 | HR 93 | Temp 98.3°F | Resp 16 | Wt 125.1 lb

## 2018-08-02 DIAGNOSIS — D565 Hemoglobin E-beta thalassemia: Secondary | ICD-10-CM

## 2018-08-02 DIAGNOSIS — Z79899 Other long term (current) drug therapy: Secondary | ICD-10-CM | POA: Insufficient documentation

## 2018-08-02 DIAGNOSIS — D508 Other iron deficiency anemias: Secondary | ICD-10-CM

## 2018-08-02 DIAGNOSIS — D509 Iron deficiency anemia, unspecified: Secondary | ICD-10-CM | POA: Insufficient documentation

## 2018-08-02 DIAGNOSIS — C561 Malignant neoplasm of right ovary: Secondary | ICD-10-CM | POA: Diagnosis not present

## 2018-08-02 DIAGNOSIS — R509 Fever, unspecified: Secondary | ICD-10-CM | POA: Diagnosis not present

## 2018-08-02 DIAGNOSIS — R05 Cough: Secondary | ICD-10-CM | POA: Diagnosis not present

## 2018-08-02 DIAGNOSIS — R531 Weakness: Secondary | ICD-10-CM | POA: Diagnosis not present

## 2018-08-02 DIAGNOSIS — R0981 Nasal congestion: Secondary | ICD-10-CM | POA: Insufficient documentation

## 2018-08-02 DIAGNOSIS — D561 Beta thalassemia: Secondary | ICD-10-CM | POA: Insufficient documentation

## 2018-08-02 DIAGNOSIS — R5383 Other fatigue: Secondary | ICD-10-CM | POA: Diagnosis not present

## 2018-08-02 LAB — CBC WITH DIFFERENTIAL (CANCER CENTER ONLY)
ABS IMMATURE GRANULOCYTES: 0.21 10*3/uL — AB (ref 0.00–0.07)
Basophils Absolute: 0.1 10*3/uL (ref 0.0–0.1)
Basophils Relative: 0 %
EOS PCT: 1 %
Eosinophils Absolute: 0.4 10*3/uL (ref 0.0–0.5)
HEMATOCRIT: 33.6 % — AB (ref 36.0–46.0)
HEMOGLOBIN: 10.9 g/dL — AB (ref 12.0–15.0)
Immature Granulocytes: 1 %
LYMPHS ABS: 3.3 10*3/uL (ref 0.7–4.0)
LYMPHS PCT: 11 %
MCH: 19.8 pg — AB (ref 26.0–34.0)
MCHC: 32.4 g/dL (ref 30.0–36.0)
MCV: 61 fL — ABNORMAL LOW (ref 80.0–100.0)
MONO ABS: 1.9 10*3/uL — AB (ref 0.1–1.0)
Monocytes Relative: 6 %
NEUTROS PCT: 81 %
NRBC: 0 % (ref 0.0–0.2)
Neutro Abs: 23.6 10*3/uL — ABNORMAL HIGH (ref 1.7–7.7)
Platelet Count: 424 10*3/uL — ABNORMAL HIGH (ref 150–400)
RBC: 5.51 MIL/uL — AB (ref 3.87–5.11)
RDW: 16.8 % — ABNORMAL HIGH (ref 11.5–15.5)
WBC Count: 29.5 10*3/uL — ABNORMAL HIGH (ref 4.0–10.5)

## 2018-08-02 LAB — CMP (CANCER CENTER ONLY)
ALBUMIN: 3.8 g/dL (ref 3.5–5.0)
ALK PHOS: 83 U/L (ref 26–84)
ALT: 34 U/L (ref 10–47)
AST: 39 U/L — ABNORMAL HIGH (ref 11–38)
Anion gap: 7 (ref 5–15)
BILIRUBIN TOTAL: 0.7 mg/dL (ref 0.2–1.6)
BUN: 5 mg/dL — ABNORMAL LOW (ref 7–22)
CO2: 29 mmol/L (ref 18–33)
CREATININE: 0.7 mg/dL (ref 0.60–1.20)
Calcium: 9.3 mg/dL (ref 8.0–10.3)
Chloride: 106 mmol/L (ref 98–108)
Glucose, Bld: 115 mg/dL (ref 73–118)
Potassium: 3.2 mmol/L — ABNORMAL LOW (ref 3.3–4.7)
SODIUM: 142 mmol/L (ref 128–145)
Total Protein: 7.3 g/dL (ref 6.4–8.1)

## 2018-08-02 MED ORDER — AMOXICILLIN-POT CLAVULANATE 875-125 MG PO TABS: 1.0000 | ORAL_TABLET | Freq: Two times a day (BID) | ORAL | 0 refills | Status: DC

## 2018-08-02 MED ORDER — FOLIC ACID 1 MG PO TABS: 1.0000 mg | ORAL_TABLET | Freq: Every day | ORAL | 11 refills | Status: DC

## 2018-08-02 MED ORDER — FERROUS SULFATE 325 (65 FE) MG PO TABS: 325.0000 mg | ORAL_TABLET | Freq: Two times a day (BID) | ORAL | 5 refills | Status: DC

## 2018-08-02 MED ORDER — FUSION PLUS PO CAPS: 1.0000 | ORAL_CAPSULE | Freq: Every day | ORAL | 12 refills | Status: DC

## 2018-08-02 NOTE — Progress Notes (Signed)
Hematology and Oncology Follow Up Visit  Shari Murray [id number] 04/17/1986 32 y.o. 08/02/2018   Principle Diagnosis:  Hgb E - beta thalassemia Iron deficiency anemia   Current Therapy:   Folic acid 1 mg PO daily IV iron as indicated - last received in January 2019    Interim History: Shari Murray is here today for follow-up.  She does not feel that well.  She is had sinus issues.  She also has some discomfort on the left side of her face.  She has had problems with this before.  She says she had a fever this morning of 100 degrees.  She does not take at the Foley catheter oral iron on a schedule.  She said that she takes it when she remembers to take it.  Her iron studies we last saw her in August showed a ferritin of 117 with an iron saturation of 37%.  She has had no nausea or vomiting.  There is been no change in bowel or bladder habits.  She has had no rashes.  There is been no leg swelling.  Overall, her performance status is ECOG 1..  Medications:  Allergies as of 08/02/2018      Reactions   Shellfish Allergy Hives, Itching      Medication List        Accurate as of 08/02/18  3:36 PM. Always use your most recent med list.          ferrous sulfate 325 (65 FE) MG tablet Take 1 tablet (325 mg total) by mouth 3 (three) times daily - between meals and at bedtime.   folic acid 1 MG tablet Commonly known as:  FOLVITE Take 1 tablet (1 mg total) by mouth daily.   ibuprofen 600 MG tablet Commonly known as:  ADVIL,MOTRIN Take 1 tablet (600 mg total) by mouth every 6 (six) hours.   omeprazole 20 MG capsule Commonly known as:  PRILOSEC Take 1 capsule (20 mg total) 2 (two) times daily before a meal by mouth.   prenatal multivitamin Tabs tablet Take 1 tablet by mouth daily at 12 noon.       Allergies:  Allergies  Allergen Reactions  . Shellfish Allergy Hives and Itching    Past Medical History, Surgical history, Social history, and Family History  were reviewed and updated.  Review of Systems: Review of Systems  Constitutional: Positive for malaise/fatigue.  HENT: Positive for congestion and sinus pain.   Eyes: Negative.   Respiratory: Positive for cough.   Cardiovascular: Negative.   Gastrointestinal: Negative.   Genitourinary: Negative.   Musculoskeletal: Negative.   Skin: Negative.   Neurological: Negative.   Endo/Heme/Allergies: Negative.   Psychiatric/Behavioral: Negative.      Physical Exam:  weight is 125 lb 1.9 oz (56.8 kg). Her oral temperature is 98.3 F (36.8 C). Her blood pressure is 112/64 and her pulse is 93. Her respiration is 16 and oxygen saturation is 100%.   Wt Readings from Last 3 Encounters:  08/02/18 125 lb 1.9 oz (56.8 kg)  05/03/18 125 lb (56.7 kg)  02/01/18 119 lb (54 kg)    Physical Exam  Constitutional: She is oriented to person, place, and time.  HENT:  Head: Normocephalic and atraumatic.  Mouth/Throat: Oropharynx is clear and moist.  Head and neck exam shows some slight swelling on the left side of her face.  She has some slight tenderness to palpation over her maxillary sinuses.  There is no obvious intraoral lesions.  She does have enlarged  tonsils with the left tonsil being slightly more prominent than the right tonsil.  There is no obvious adenopathy in the neck.  Eyes: Pupils are equal, round, and reactive to light. EOM are normal.  Neck: Normal range of motion.  Cardiovascular: Normal rate, regular rhythm and normal heart sounds.  Pulmonary/Chest: Effort normal and breath sounds normal.  Abdominal: Soft. Bowel sounds are normal.  Musculoskeletal: Normal range of motion. She exhibits no edema, tenderness or deformity.  Lymphadenopathy:    She has no cervical adenopathy.  Neurological: She is alert and oriented to person, place, and time.  Skin: Skin is warm and dry. No rash noted. No erythema.  Psychiatric: She has a normal mood and affect. Her behavior is normal. Judgment and  thought content normal.  Vitals reviewed.    Lab Results  Component Value Date   WBC 29.5 (H) 08/02/2018   HGB 10.9 (L) 08/02/2018   HCT 33.6 (L) 08/02/2018   MCV 61.0 (L) 08/02/2018   PLT 424 (H) 08/02/2018   Lab Results  Component Value Date   FERRITIN 117 05/03/2018   IRON 117 05/03/2018   TIBC 319 05/03/2018   UIBC 202 05/03/2018   IRONPCTSAT 37 05/03/2018   Lab Results  Component Value Date   RETICCTPCT 2.9 (H) 02/16/2013   RBC 5.51 (H) 08/02/2018   RETICCTABS 135.4 02/16/2013   No results found for: KPAFRELGTCHN, LAMBDASER, KAPLAMBRATIO No results found for: IGGSERUM, IGA, IGMSERUM No results found for: Odetta Pink, SPEI   Chemistry      Component Value Date/Time   NA 142 08/02/2018 1451   NA 141 06/24/2017 1108   K 3.2 (L) 08/02/2018 1451   K 3.8 06/24/2017 1108   CL 106 08/02/2018 1451   CL 107 06/24/2017 1108   CO2 29 08/02/2018 1451   CO2 27 06/24/2017 1108   BUN 5 (L) 08/02/2018 1451   BUN 4 (L) 06/24/2017 1108   CREATININE 0.70 08/02/2018 1451   CREATININE 0.5 (L) 06/24/2017 1108      Component Value Date/Time   CALCIUM 9.3 08/02/2018 1451   CALCIUM 9.5 06/24/2017 1108   ALKPHOS 83 08/02/2018 1451   ALKPHOS 61 06/24/2017 1108   AST 39 (H) 08/02/2018 1451   ALT 34 08/02/2018 1451   ALT 22 06/24/2017 1108   BILITOT 0.7 08/02/2018 1451      Impression and Plan: Shari Murray is a very pleasant 32 yo [name] with Hgb E beta thalassemia as well as iron deficiency anemia.   I am worried that she might have a an infection.  I looked at her peripheral blood smear.  Though white blood cells look like they are reactive.  I do not see anything that looks malignant.  I will go ahead and call in a prescription for Augmentin for her.  I think Augmentin (875 mg p.o. twice daily x10 days) would be reasonable.  I do not think we need any x-rays.  I try to enforce that taking the folic acid  and oral iron would be helpful for her.  I called in some Fusion Plus for her to take for iron.  I think this would be better than just plain old over-the-counter iron supplementation.  I told her that if she does not get better, that she needs to talk to her family doctor.  She understands this.  I will plan to see her back in another 6 weeks.   Volanda Napoleon, MD 11/20/20193:36 PM

## 2018-08-03 ENCOUNTER — Telehealth: Payer: Self-pay | Admitting: *Deleted

## 2018-08-03 LAB — FERRITIN: FERRITIN: 108 ng/mL (ref 11–307)

## 2018-08-03 LAB — IRON AND TIBC
Iron: 16 ug/dL — ABNORMAL LOW (ref 41–142)
Saturation Ratios: 5 % — ABNORMAL LOW (ref 21–57)
TIBC: 330 ug/dL (ref 236–444)
UIBC: 314 ug/dL (ref 120–384)

## 2018-08-03 NOTE — Telephone Encounter (Addendum)
-----   Message from Shari Napoleon, MD sent at 08/03/2018  1:52 PM EST ----- Called patient to let her know that her  iron is pitifully low!!!  Told patient Dr. Marin Olp wants her to take the oral iron daily or we will need IV iron!!!

## 2018-10-04 ENCOUNTER — Inpatient Hospital Stay: Payer: Commercial Managed Care - PPO

## 2018-10-04 ENCOUNTER — Other Ambulatory Visit: Payer: Self-pay

## 2018-10-04 ENCOUNTER — Encounter: Payer: Self-pay | Admitting: Hematology & Oncology

## 2018-10-04 ENCOUNTER — Inpatient Hospital Stay: Payer: Commercial Managed Care - PPO | Attending: Family | Admitting: Hematology & Oncology

## 2018-10-04 VITALS — BP 107/64 | HR 78 | Temp 99.2°F | Resp 18 | Wt 122.0 lb

## 2018-10-04 DIAGNOSIS — K5900 Constipation, unspecified: Secondary | ICD-10-CM | POA: Insufficient documentation

## 2018-10-04 DIAGNOSIS — D561 Beta thalassemia: Secondary | ICD-10-CM | POA: Insufficient documentation

## 2018-10-04 DIAGNOSIS — R05 Cough: Secondary | ICD-10-CM | POA: Diagnosis not present

## 2018-10-04 DIAGNOSIS — D508 Other iron deficiency anemias: Secondary | ICD-10-CM

## 2018-10-04 DIAGNOSIS — D565 Hemoglobin E-beta thalassemia: Secondary | ICD-10-CM

## 2018-10-04 DIAGNOSIS — D509 Iron deficiency anemia, unspecified: Secondary | ICD-10-CM | POA: Insufficient documentation

## 2018-10-04 DIAGNOSIS — R5383 Other fatigue: Secondary | ICD-10-CM | POA: Diagnosis not present

## 2018-10-04 DIAGNOSIS — R531 Weakness: Secondary | ICD-10-CM | POA: Diagnosis not present

## 2018-10-04 LAB — CMP (CANCER CENTER ONLY)
ALBUMIN: 4.8 g/dL (ref 3.5–5.0)
ALT: 26 U/L (ref 0–44)
AST: 20 U/L (ref 15–41)
Alkaline Phosphatase: 72 U/L (ref 38–126)
Anion gap: 9 (ref 5–15)
BUN: 6 mg/dL (ref 6–20)
CO2: 27 mmol/L (ref 22–32)
CREATININE: 0.65 mg/dL (ref 0.44–1.00)
Calcium: 9.5 mg/dL (ref 8.9–10.3)
Chloride: 103 mmol/L (ref 98–111)
GFR, Est AFR Am: >60 mL/min (ref >60)
GLUCOSE: 147 mg/dL — AB (ref 70–99)
Potassium: 3.5 mmol/L (ref 3.5–5.1)
SODIUM: 139 mmol/L (ref 135–145)
Total Bilirubin: 0.4 mg/dL (ref 0.3–1.2)
Total Protein: 7.2 g/dL (ref 6.5–8.1)

## 2018-10-04 LAB — CBC WITH DIFFERENTIAL (CANCER CENTER ONLY)
ABS IMMATURE GRANULOCYTES: 0.09 10*3/uL — AB (ref 0.00–0.07)
Basophils Absolute: 0.1 10*3/uL (ref 0.0–0.1)
Basophils Relative: 1 %
Eosinophils Absolute: 0.3 10*3/uL (ref 0.0–0.5)
Eosinophils Relative: 2 %
HCT: 35.6 % — ABNORMAL LOW (ref 36.0–46.0)
HEMOGLOBIN: 11.9 g/dL — AB (ref 12.0–15.0)
Immature Granulocytes: 1 %
LYMPHS PCT: 27 %
Lymphs Abs: 4.7 10*3/uL — ABNORMAL HIGH (ref 0.7–4.0)
MCH: 20.5 pg — AB (ref 26.0–34.0)
MCHC: 33.4 g/dL (ref 30.0–36.0)
MCV: 61.4 fL — ABNORMAL LOW (ref 80.0–100.0)
Monocytes Absolute: 0.9 10*3/uL (ref 0.1–1.0)
Monocytes Relative: 5 %
NEUTROS ABS: 11.3 10*3/uL — AB (ref 1.7–7.7)
Neutrophils Relative %: 64 %
Platelet Count: 425 10*3/uL — ABNORMAL HIGH (ref 150–400)
RBC: 5.8 MIL/uL — ABNORMAL HIGH (ref 3.87–5.11)
RDW: 18.8 % — ABNORMAL HIGH (ref 11.5–15.5)
WBC: 17.4 10*3/uL — AB (ref 4.0–10.5)
nRBC: 0 % (ref 0.0–0.2)

## 2018-10-04 LAB — RETICULOCYTES
IMMATURE RETIC FRACT: 27.3 % — AB (ref 2.3–15.9)
RBC.: 5.8 MIL/uL — ABNORMAL HIGH (ref 3.87–5.11)
RETIC COUNT ABSOLUTE: 137.5 10*3/uL (ref 19.0–186.0)
RETIC CT PCT: 2.4 % (ref 0.4–3.1)

## 2018-10-04 LAB — SAVE SMEAR (SSMR)

## 2018-10-04 NOTE — Progress Notes (Signed)
Hematology and Oncology Follow Up Visit  Shari Murray [id number] 12-Sep-1986 33 y.o. 10/04/2018   Principle Diagnosis:  Hgb E - beta thalassemia Iron deficiency anemia   Current Therapy:   Folic acid 1 mg PO daily Fusion Plus I po q day    Interim History: Shari Murray is here today for follow-up.  She is feeling a lot better.  Apparently, the oral iron that we gave her is working.  She has not had any abdominal issues.  There is been no constipation.  She has had no nausea or vomiting.  When we last saw her, her iron studies showed a ferritin of 108 with an iron saturation of only 5%.  She had no problems over the holiday season.  There is been no problems with bleeding.  She apparently is on some type of oral contraceptive that decreases her monthly cycle flow.  She has had no rashes.  There is been no cough or shortness of breath.  She has had no dysuria.  She has had no diarrhea.  Overall, her performance status is ECOG 1.   Medications:  Allergies as of 10/04/2018      Reactions   Shellfish Allergy Hives, Itching      Medication List       Accurate as of October 04, 2018  3:23 PM. Always use your most recent med list.        amoxicillin-clavulanate 875-125 MG tablet Commonly known as:  AUGMENTIN Take 1 tablet by mouth 2 (two) times daily.   folic acid 1 MG tablet Commonly known as:  FOLVITE Take 1 tablet (1 mg total) by mouth daily.   FUSION PLUS Caps Take 1 tablet by mouth daily.   ibuprofen 600 MG tablet Commonly known as:  ADVIL,MOTRIN Take 1 tablet (600 mg total) by mouth every 6 (six) hours.   omeprazole 20 MG capsule Commonly known as:  PRILOSEC Take 1 capsule (20 mg total) 2 (two) times daily before a meal by mouth.       Allergies:  Allergies  Allergen Reactions  . Shellfish Allergy Hives and Itching    Past Medical History, Surgical history, Social history, and Family History were reviewed and updated.  Review of  Systems: Review of Systems  Constitutional: Positive for malaise/fatigue.  HENT: Positive for congestion and sinus pain.   Eyes: Negative.   Respiratory: Positive for cough.   Cardiovascular: Negative.   Gastrointestinal: Negative.   Genitourinary: Negative.   Musculoskeletal: Negative.   Skin: Negative.   Neurological: Negative.   Endo/Heme/Allergies: Negative.   Psychiatric/Behavioral: Negative.      Physical Exam:  weight is 122 lb (55.3 kg). Her oral temperature is 99.2 F (37.3 C). Her blood pressure is 107/64 and her pulse is 78. Her respiration is 18 and oxygen saturation is 99%.   Wt Readings from Last 3 Encounters:  10/04/18 122 lb (55.3 kg)  08/02/18 125 lb 1.9 oz (56.8 kg)  05/03/18 125 lb (56.7 kg)    Physical Exam Vitals signs reviewed.  HENT:     Head: Normocephalic and atraumatic.  Eyes:     Pupils: Pupils are equal, round, and reactive to light.  Neck:     Musculoskeletal: Normal range of motion.  Cardiovascular:     Rate and Rhythm: Normal rate and regular rhythm.     Heart sounds: Normal heart sounds.  Pulmonary:     Effort: Pulmonary effort is normal.     Breath sounds: Normal breath sounds.  Abdominal:  General: Bowel sounds are normal.     Palpations: Abdomen is soft.  Musculoskeletal: Normal range of motion.        General: No tenderness or deformity.  Lymphadenopathy:     Cervical: No cervical adenopathy.  Skin:    General: Skin is warm and dry.     Findings: No erythema or rash.  Neurological:     Mental Status: She is alert and oriented to person, place, and time.  Psychiatric:        Behavior: Behavior normal.        Thought Content: Thought content normal.        Judgment: Judgment normal.      Lab Results  Component Value Date   WBC 17.4 (H) 10/04/2018   HGB 11.9 (L) 10/04/2018   HCT 35.6 (L) 10/04/2018   MCV 61.4 (L) 10/04/2018   PLT 425 (H) 10/04/2018   Lab Results  Component Value Date   FERRITIN 108 08/02/2018    IRON 16 (L) 08/02/2018   TIBC 330 08/02/2018   UIBC 314 08/02/2018   IRONPCTSAT 5 (L) 08/02/2018   Lab Results  Component Value Date   RETICCTPCT 2.4 10/04/2018   RBC 5.80 (H) 10/04/2018   RBC 5.80 (H) 10/04/2018   RETICCTABS 135.4 02/16/2013   No results found for: KPAFRELGTCHN, LAMBDASER, KAPLAMBRATIO No results found for: IGGSERUM, IGA, IGMSERUM No results found for: Odetta Pink, SPEI   Chemistry      Component Value Date/Time   NA 142 08/02/2018 1451   NA 141 06/24/2017 1108   K 3.2 (L) 08/02/2018 1451   K 3.8 06/24/2017 1108   CL 106 08/02/2018 1451   CL 107 06/24/2017 1108   CO2 29 08/02/2018 1451   CO2 27 06/24/2017 1108   BUN 5 (L) 08/02/2018 1451   BUN 4 (L) 06/24/2017 1108   CREATININE 0.70 08/02/2018 1451   CREATININE 0.5 (L) 06/24/2017 1108      Component Value Date/Time   CALCIUM 9.3 08/02/2018 1451   CALCIUM 9.5 06/24/2017 1108   ALKPHOS 83 08/02/2018 1451   ALKPHOS 61 06/24/2017 1108   AST 39 (H) 08/02/2018 1451   ALT 34 08/02/2018 1451   ALT 22 06/24/2017 1108   BILITOT 0.7 08/02/2018 1451      Impression and Plan: Shari Murray is a very pleasant 33 yo [name] with Hgb E beta thalassemia as well as iron deficiency anemia.   We will continue her on the Fusion Plus.  This is working for her.  She is tolerating it quite well.  I will now see her back in about 3-4 months.  I feel confident that we can go that long before seeing her again.  I am just happy that she is doing better and that her quality of life is doing better.     Volanda Napoleon, MD 1/22/20203:23 PM

## 2018-10-05 ENCOUNTER — Telehealth: Payer: Self-pay | Admitting: *Deleted

## 2018-10-05 LAB — IRON AND TIBC
Iron: 58 ug/dL (ref 41–142)
SATURATION RATIOS: 17 % — AB (ref 21–57)
TIBC: 350 ug/dL (ref 236–444)
UIBC: 292 ug/dL (ref 120–384)

## 2018-10-05 LAB — FERRITIN: Ferritin: 96 ng/mL (ref 11–307)

## 2018-10-05 LAB — LACTATE DEHYDROGENASE: LDH: 153 U/L (ref 98–192)

## 2018-10-05 NOTE — Telephone Encounter (Signed)
As noted below by Dr. Marin Olp, I left a message on her cell phone with lab results and to continue taking oral iron. Instructed her to call the office if she has any questions or concerns.

## 2018-10-05 NOTE — Telephone Encounter (Signed)
-----   Message from Volanda Napoleon, MD sent at 10/05/2018 11:08 AM EST ----- Call - iron is better but still low!!  Keep on the oral iron!!!  Laurey Arrow

## 2019-02-01 ENCOUNTER — Encounter: Payer: Self-pay | Admitting: *Deleted

## 2019-02-02 ENCOUNTER — Other Ambulatory Visit: Payer: Self-pay

## 2019-02-02 ENCOUNTER — Inpatient Hospital Stay: Payer: Commercial Managed Care - PPO | Attending: Family | Admitting: Family

## 2019-02-02 ENCOUNTER — Encounter: Payer: Self-pay | Admitting: Family

## 2019-02-02 ENCOUNTER — Inpatient Hospital Stay: Payer: Commercial Managed Care - PPO

## 2019-02-02 VITALS — BP 111/75 | HR 72 | Resp 16 | Wt 118.8 lb

## 2019-02-02 DIAGNOSIS — D565 Hemoglobin E-beta thalassemia: Secondary | ICD-10-CM | POA: Insufficient documentation

## 2019-02-02 DIAGNOSIS — R5383 Other fatigue: Secondary | ICD-10-CM | POA: Diagnosis not present

## 2019-02-02 DIAGNOSIS — D508 Other iron deficiency anemias: Secondary | ICD-10-CM

## 2019-02-02 DIAGNOSIS — D509 Iron deficiency anemia, unspecified: Secondary | ICD-10-CM | POA: Diagnosis not present

## 2019-02-02 LAB — CBC WITH DIFFERENTIAL (CANCER CENTER ONLY)
Abs Immature Granulocytes: 0.13 10*3/uL — ABNORMAL HIGH (ref 0.00–0.07)
Basophils Absolute: 0.1 10*3/uL (ref 0.0–0.1)
Basophils Relative: 1 %
Eosinophils Absolute: 0.3 10*3/uL (ref 0.0–0.5)
Eosinophils Relative: 2 %
HCT: 36.9 % (ref 36.0–46.0)
Hemoglobin: 12.1 g/dL (ref 12.0–15.0)
Immature Granulocytes: 1 %
Lymphocytes Relative: 22 %
Lymphs Abs: 3.6 10*3/uL (ref 0.7–4.0)
MCH: 19.7 pg — ABNORMAL LOW (ref 26.0–34.0)
MCHC: 32.8 g/dL (ref 30.0–36.0)
MCV: 60.1 fL — ABNORMAL LOW (ref 80.0–100.0)
Monocytes Absolute: 0.8 10*3/uL (ref 0.1–1.0)
Monocytes Relative: 5 %
Neutro Abs: 11.6 10*3/uL — ABNORMAL HIGH (ref 1.7–7.7)
Neutrophils Relative %: 69 %
Platelet Count: 418 10*3/uL — ABNORMAL HIGH (ref 150–400)
RBC: 6.14 MIL/uL — ABNORMAL HIGH (ref 3.87–5.11)
RDW: 17.7 % — ABNORMAL HIGH (ref 11.5–15.5)
WBC Count: 16.6 10*3/uL — ABNORMAL HIGH (ref 4.0–10.5)
nRBC: 0 % (ref 0.0–0.2)

## 2019-02-02 LAB — RETICULOCYTES
Immature Retic Fract: 25.5 % — ABNORMAL HIGH (ref 2.3–15.9)
RBC.: 6.13 MIL/uL — ABNORMAL HIGH (ref 3.87–5.11)
Retic Count, Absolute: 126.3 10*3/uL (ref 19.0–186.0)
Retic Ct Pct: 2.1 % (ref 0.4–3.1)

## 2019-02-02 LAB — SAVE SMEAR(SSMR), FOR PROVIDER SLIDE REVIEW

## 2019-02-02 NOTE — Progress Notes (Signed)
Hematology and Oncology Follow Up Visit  Shari Murray [id number] Nov 12, 1985 33 y.o. 02/02/2019   Principle Diagnosis:  Hgb E - beta thalassemia Iron deficiency anemia   Current Therapy:   Folic acid 1 mg PO daily Fusion Plus I po q day     Interim History:  Shari Murray is here today for follow-up. She is feeling fatigued at times. She stays busy with her 2 small children at home.  They are walking daily for exercise and to get some time out of the house.  She is taking her folic acid and oral iron as prescribed daily. This seems to be helping her counts. Hgb is now up to 12.1.  She has not had any issues with blood loss or bruising.  No fever, chills, n/v, cough, rash, dizziness, SOB, chest pain, palpitations, abdominal pain or changes in bowel or bladder habits.  No swelling, tenderness, numbness or tingling in her extremities.  No lymphadenopathy noted on exam.  Her appetite comes and goes. She is staying well hydrated. Her weight is stable.   ECOG Performance Status: 1 - Symptomatic but completely ambulatory  Medications:  Allergies as of 02/02/2019      Reactions   Shellfish Allergy Hives, Itching      Medication List       Accurate as of Feb 02, 2019  3:25 PM. If you have any questions, ask your nurse or doctor.        folic acid 1 MG tablet Commonly known as:  FOLVITE Take 1 tablet (1 mg total) by mouth daily.   Fusion Plus Caps Take 1 tablet by mouth daily.   ibuprofen 600 MG tablet Commonly known as:  ADVIL Take 1 tablet (600 mg total) by mouth every 6 (six) hours.   omeprazole 20 MG capsule Commonly known as:  PriLOSEC Take 1 capsule (20 mg total) 2 (two) times daily before a meal by mouth.       Allergies:  Allergies  Allergen Reactions  . Shellfish Allergy Hives and Itching    Past Medical History, Surgical history, Social history, and Family History were reviewed and updated.  Review of Systems: All other 10 point review of  systems is negative.   Physical Exam:  vitals were not taken for this visit.   Wt Readings from Last 3 Encounters:  10/04/18 122 lb (55.3 kg)  08/02/18 125 lb 1.9 oz (56.8 kg)  05/03/18 125 lb (56.7 kg)    Ocular: Sclerae unicteric, pupils equal, round and reactive to light Ear-nose-throat: Oropharynx clear, dentition fair Lymphatic: No cervical or supraclavicular adenopathy Lungs no rales or rhonchi, good excursion bilaterally Heart regular rate and rhythm, no murmur appreciated Abd soft, nontender, positive bowel sounds, no liver or spleen tip palpated on exam, no fluid wave MSK no focal spinal tenderness, no joint edema Neuro: non-focal, well-oriented, appropriate affect Breasts: Deferred   Lab Results  Component Value Date   WBC 17.4 (H) 10/04/2018   HGB 11.9 (L) 10/04/2018   HCT 35.6 (L) 10/04/2018   MCV 61.4 (L) 10/04/2018   PLT 425 (H) 10/04/2018   Lab Results  Component Value Date   FERRITIN 96 10/04/2018   IRON 58 10/04/2018   TIBC 350 10/04/2018   UIBC 292 10/04/2018   IRONPCTSAT 17 (L) 10/04/2018   Lab Results  Component Value Date   RETICCTPCT 2.4 10/04/2018   RBC 5.80 (H) 10/04/2018   RBC 5.80 (H) 10/04/2018   RETICCTABS 135.4 02/16/2013   No results found for: KPAFRELGTCHN,  LAMBDASER, KAPLAMBRATIO No results found for: IGGSERUM, IGA, IGMSERUM No results found for: Odetta Pink, SPEI   Chemistry      Component Value Date/Time   NA 139 10/04/2018 1454   NA 141 06/24/2017 1108   K 3.5 10/04/2018 1454   K 3.8 06/24/2017 1108   CL 103 10/04/2018 1454   CL 107 06/24/2017 1108   CO2 27 10/04/2018 1454   CO2 27 06/24/2017 1108   BUN 6 10/04/2018 1454   BUN 4 (L) 06/24/2017 1108   CREATININE 0.65 10/04/2018 1454   CREATININE 0.5 (L) 06/24/2017 1108      Component Value Date/Time   CALCIUM 9.5 10/04/2018 1454   CALCIUM 9.5 06/24/2017 1108   ALKPHOS 72 10/04/2018 1454   ALKPHOS 61 06/24/2017  1108   AST 20 10/04/2018 1454   ALT 26 10/04/2018 1454   ALT 22 06/24/2017 1108   BILITOT 0.4 10/04/2018 1454       Impression and Plan: Shari Murray is a very pleasant 33 yo [name] with Hgb E beta thalassemia as well as iron deficiency anemia.  She continues to do well on oral iron and folic acid. She will continue this same regimen.  We will plan to see her back in another 6 months.  She will contact our office with any questions or concerns. We can certainly see her sooner if need be.   Laverna Peace, NP 5/22/20203:25 PM

## 2019-02-06 LAB — LACTATE DEHYDROGENASE: LDH: 145 U/L (ref 98–192)

## 2019-02-06 LAB — IRON AND TIBC
Iron: 151 ug/dL — ABNORMAL HIGH (ref 41–142)
Saturation Ratios: 46 % (ref 21–57)
TIBC: 330 ug/dL (ref 236–444)
UIBC: 179 ug/dL (ref 120–384)

## 2019-02-06 LAB — FERRITIN: Ferritin: 45 ng/mL (ref 11–307)

## 2019-02-07 ENCOUNTER — Telehealth: Payer: Self-pay | Admitting: Family

## 2019-02-07 NOTE — Telephone Encounter (Signed)
Appointments scheduled calendar mailed

## 2019-06-13 IMAGING — US US MFM OB COMP +14 WKS
1 series · 14 of 28 positions shown · non-contrast
Comparison: none

[Series 1: us mfm ob comp +14 wks · 83 acquisitions, 14 frames shown]
[im 4/83]
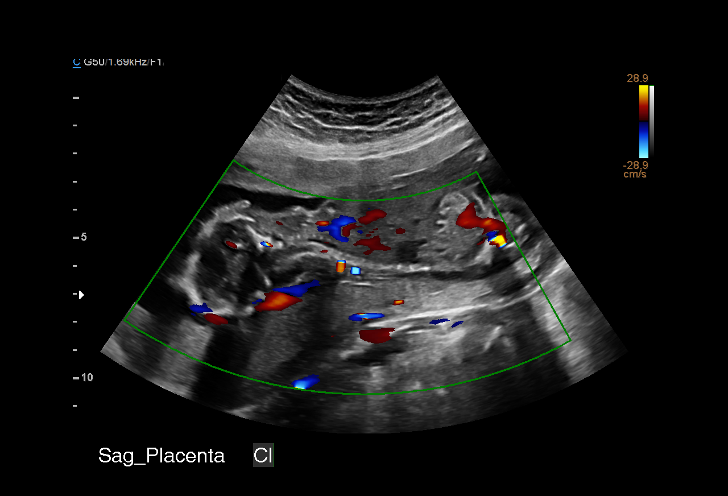
[im 10/83]
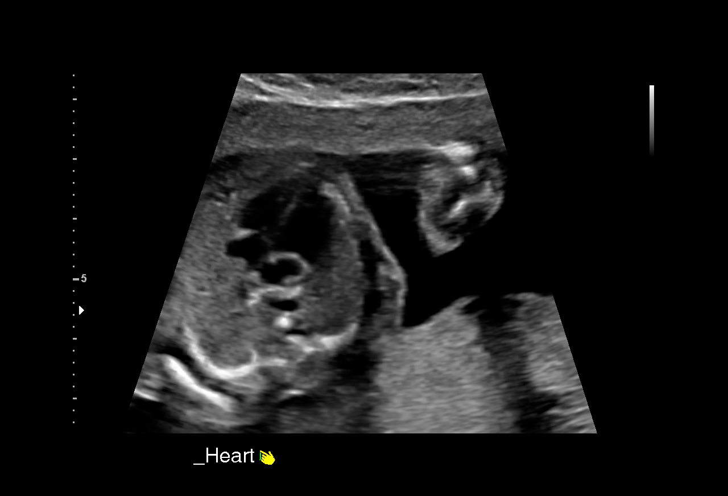
[im 16/83]
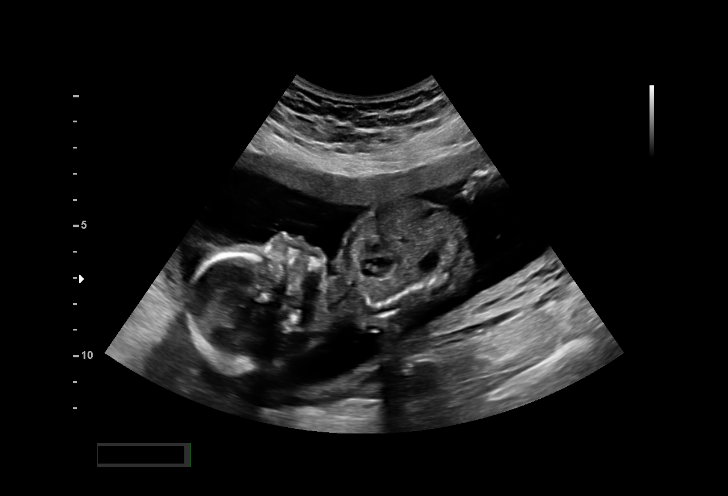
[im 22/83]
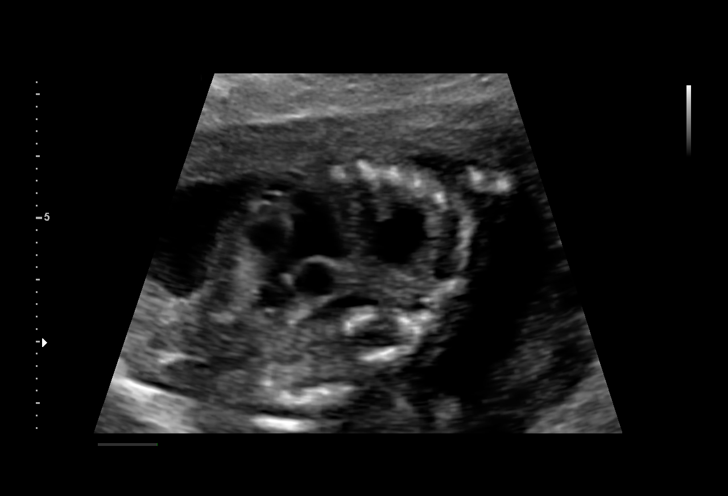
[im 28/83]
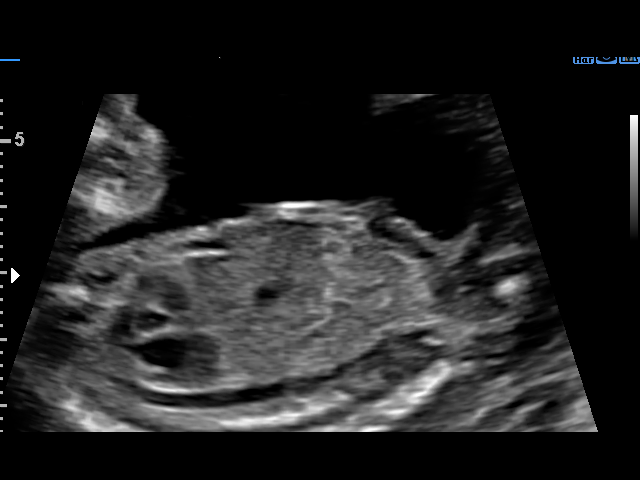
[im 34/83]
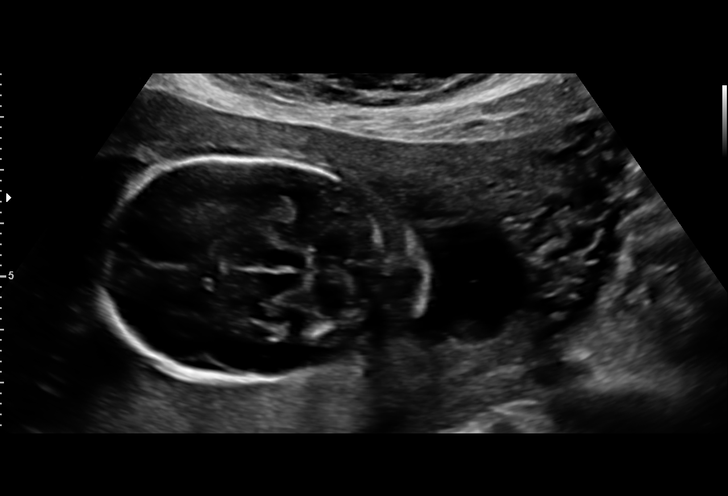
[im 40/83]
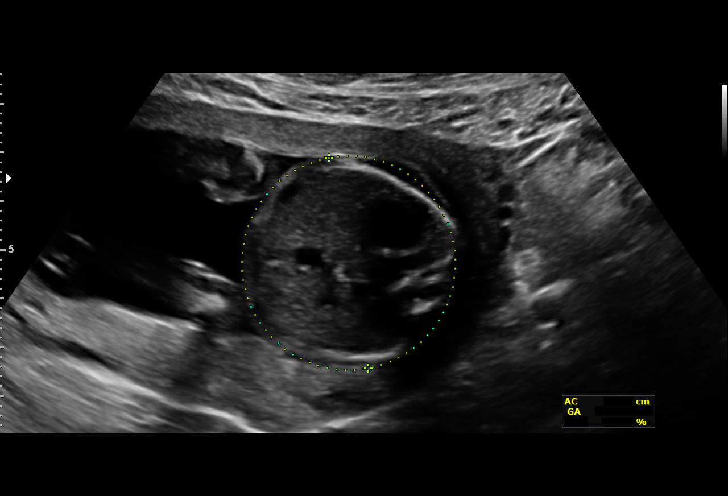
[im 46/83]
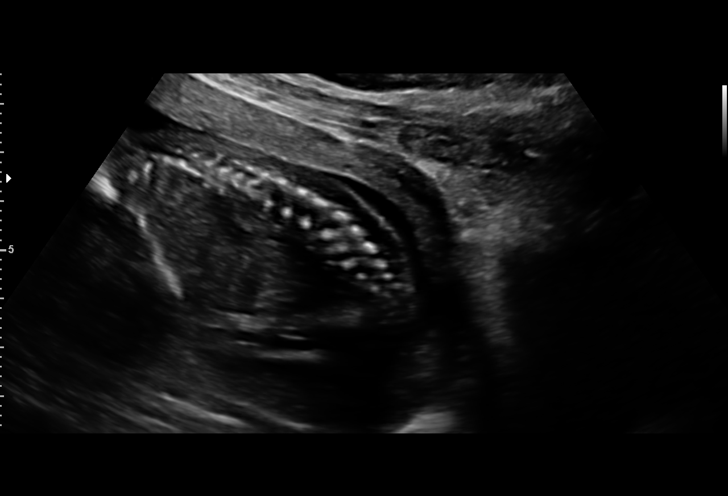
[im 52/83]
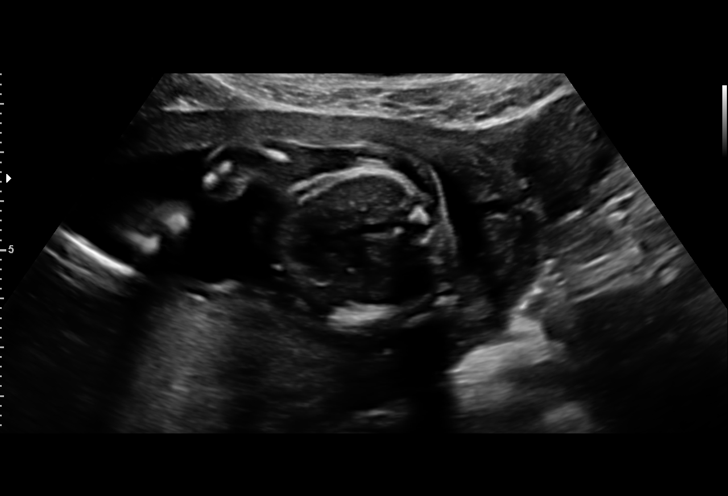
[im 58/83]
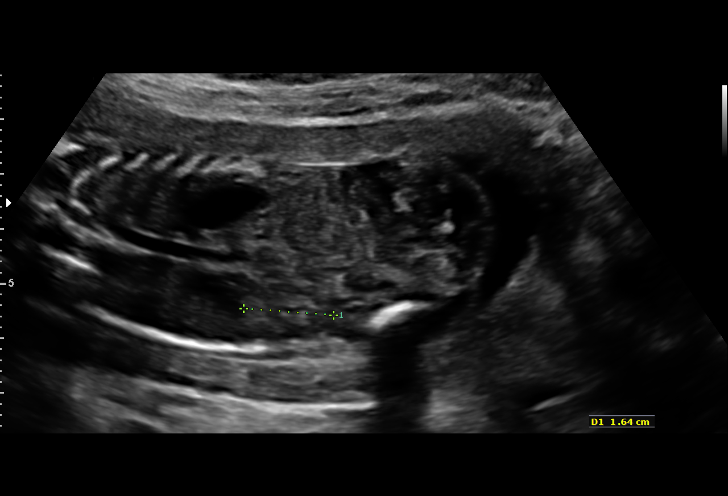
[im 64/83]
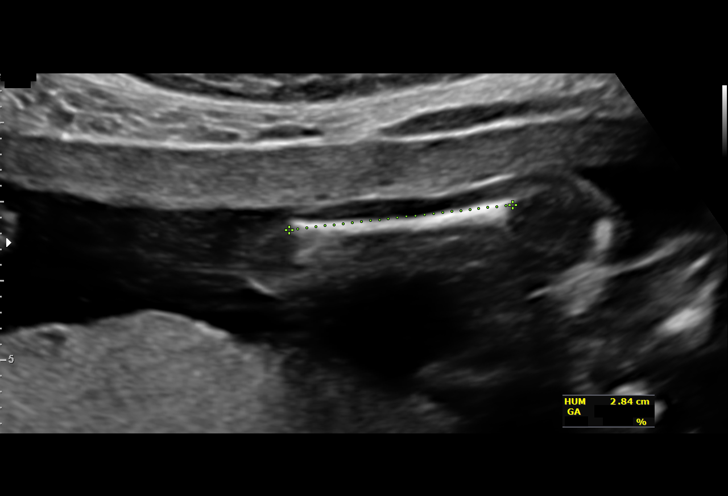
[im 70/83]
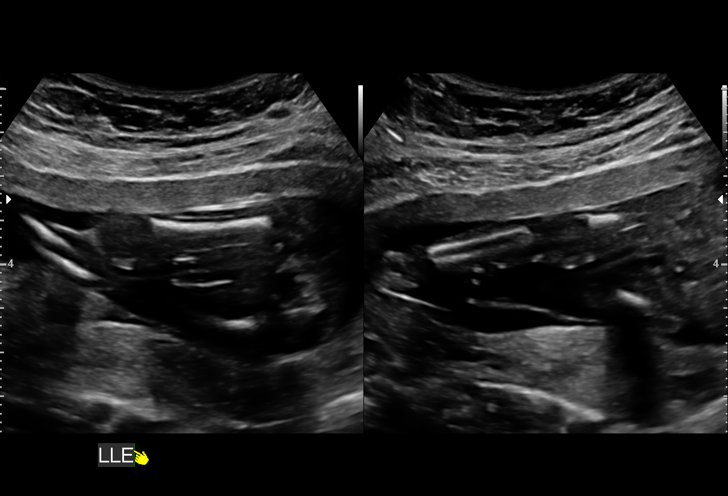
[im 76/83]
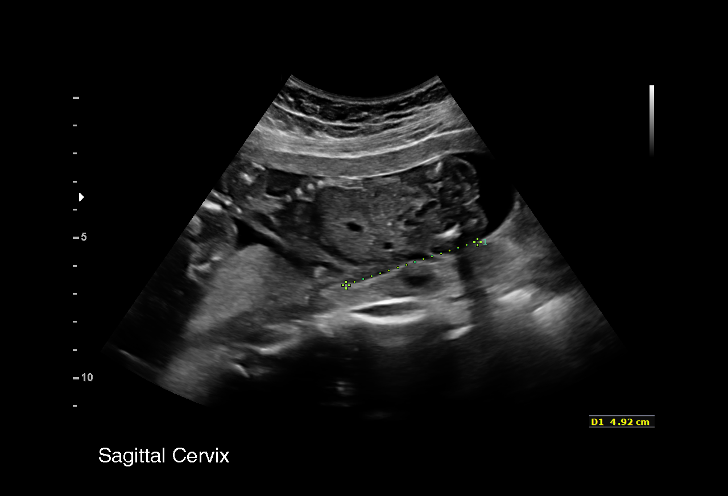
[im 83/83]
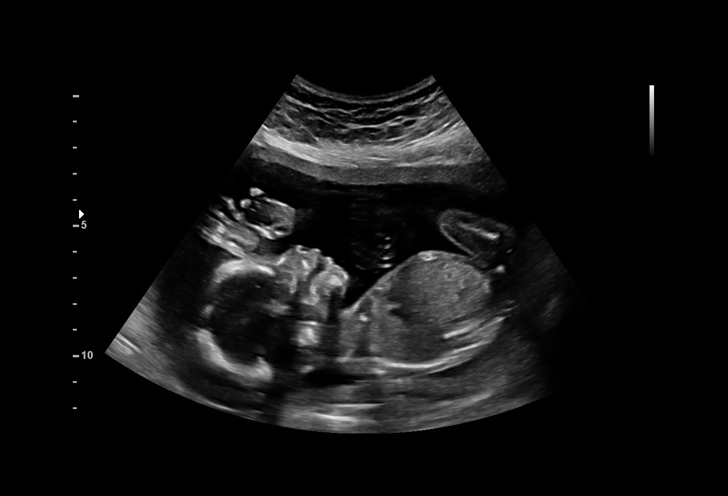

[14 of 28 positions shown; findings below may reference images not displayed]

1  HASSAN MAHAMOUD FLEUR FLEUR           [id number]      [id number]     [id number]
Indications

18 weeks gestation of pregnancy
Encounter for fetal anatomic survey
Maternal thalassemia complicating
pregnancy in second trimester
OB History

Gravidity:    3         Term:   2        Prem:   0        SAB:   0
TOP:          0       Ectopic:  0        Living: 2
Fetal Evaluation

Num Of Fetuses:     1
Fetal Heart         148
Rate(bpm):
Cardiac Activity:   Observed
Presentation:       Frank breech
Placenta:           Posterior, above cervical os
P. Cord Insertion:  Visualized

Amniotic Fluid
AFI FV:      Subjectively within normal limits

Largest Pocket(cm)
3.2
Biometry

BPD:      41.6  mm     G. Age:  18w 4d         52  %    CI:        70.09   %    70 - 86
FL/HC:      17.2   %    16.1 -
HC:      158.5  mm     G. Age:  18w 5d         50  %    HC/AC:      1.15        1.09 -
AC:      137.4  mm     G. Age:  19w 1d         66  %    FL/BPD:     65.6   %
FL:       27.3  mm     G. Age:  18w 2d         36  %    FL/AC:      19.9   %    20 - 24
Est. FW:     256  gm      0 lb 9 oz     50  %
Gestational Age

LMP:           21w 2d        Date:  02/09/17                 EDD:   11/16/17
U/S Today:     18w 5d                                        EDD:   12/04/17
Best:          18w 4d     Det. By:  Early Ultrasound         EDD:   12/05/17
(05/06/17)
Anatomy

Cranium:               Appears normal         Aortic Arch:            Appears normal
Cavum:                 Appears normal         Ductal Arch:            Appears normal
Ventricles:            Appears normal         Diaphragm:              Appears normal
Choroid Plexus:        Appears normal         Stomach:                Appears normal, left
sided
Cerebellum:            Appears normal         Abdomen:                Appears normal
Posterior Fossa:       Appears normal         Abdominal Wall:         Not well visualized
Nuchal Fold:           Not well visualized    Cord Vessels:           Appears normal (3
vessel cord)
Face:                  Orbits nl; profile not Kidneys:                Appear normal
well visualized
Lips:                  Appears normal         Bladder:                Appears normal
Thoracic:              Appears normal         Spine:                  Appears normal
Heart:                 Appears normal         Upper Extremities:      Appears normal
(4CH, axis, and situs
RVOT:                  Appears normal         Lower Extremities:      Appears normal
LVOT:                  Appears normal

Other:  Fetus appears to be a female. Heels and 5th digit visualized.
Cervix Uterus Adnexa

Cervix
Length:              4  cm.
Normal appearance by transabdominal scan.
Impression

[place] intrauterine pregnancy at 18 weeks 4 days
gestation with fetal cardiac activity
Breech presentation
Posterior placenta
Normal appearing fetal growth and amniotic fluid volume
Limited fetao anatomic survey secondary to fetal position
Normal appearing cervical length
Recommendations

Recommend follow-up ultrasound examination in 
 4 weeks

## 2019-08-06 ENCOUNTER — Inpatient Hospital Stay (HOSPITAL_BASED_OUTPATIENT_CLINIC_OR_DEPARTMENT_OTHER): Payer: Commercial Managed Care - PPO | Admitting: Family

## 2019-08-06 ENCOUNTER — Other Ambulatory Visit: Payer: Self-pay

## 2019-08-06 ENCOUNTER — Inpatient Hospital Stay: Payer: Commercial Managed Care - PPO | Attending: Family

## 2019-08-06 VITALS — BP 98/58 | HR 71 | Temp 97.5°F | Resp 17 | Wt 118.0 lb

## 2019-08-06 DIAGNOSIS — D561 Beta thalassemia: Secondary | ICD-10-CM | POA: Diagnosis not present

## 2019-08-06 DIAGNOSIS — D565 Hemoglobin E-beta thalassemia: Secondary | ICD-10-CM

## 2019-08-06 DIAGNOSIS — D509 Iron deficiency anemia, unspecified: Secondary | ICD-10-CM | POA: Insufficient documentation

## 2019-08-06 DIAGNOSIS — D508 Other iron deficiency anemias: Secondary | ICD-10-CM

## 2019-08-06 LAB — CBC WITH DIFFERENTIAL (CANCER CENTER ONLY)
Abs Immature Granulocytes: 0.07 10*3/uL (ref 0.00–0.07)
Basophils Absolute: 0.1 10*3/uL (ref 0.0–0.1)
Basophils Relative: 1 %
Eosinophils Absolute: 0.2 10*3/uL (ref 0.0–0.5)
Eosinophils Relative: 1 %
HCT: 35.6 % — ABNORMAL LOW (ref 36.0–46.0)
Hemoglobin: 11.7 g/dL — ABNORMAL LOW (ref 12.0–15.0)
Immature Granulocytes: 0 %
Lymphocytes Relative: 27 %
Lymphs Abs: 4.2 10*3/uL — ABNORMAL HIGH (ref 0.7–4.0)
MCH: 19.5 pg — ABNORMAL LOW (ref 26.0–34.0)
MCHC: 32.9 g/dL (ref 30.0–36.0)
MCV: 59.2 fL — ABNORMAL LOW (ref 80.0–100.0)
Monocytes Absolute: 0.9 10*3/uL (ref 0.1–1.0)
Monocytes Relative: 6 %
Neutro Abs: 10.2 10*3/uL — ABNORMAL HIGH (ref 1.7–7.7)
Neutrophils Relative %: 65 %
Platelet Count: 362 10*3/uL (ref 150–400)
RBC: 6.01 MIL/uL — ABNORMAL HIGH (ref 3.87–5.11)
RDW: 17.1 % — ABNORMAL HIGH (ref 11.5–15.5)
WBC Count: 15.7 10*3/uL — ABNORMAL HIGH (ref 4.0–10.5)
nRBC: 0 % (ref 0.0–0.2)

## 2019-08-06 LAB — RETICULOCYTES
Immature Retic Fract: 22.1 % — ABNORMAL HIGH (ref 2.3–15.9)
RBC.: 5.86 MIL/uL — ABNORMAL HIGH (ref 3.87–5.11)
Retic Count, Absolute: 99 10*3/uL (ref 19.0–186.0)
Retic Ct Pct: 1.7 % (ref 0.4–3.1)

## 2019-08-06 LAB — SAVE SMEAR(SSMR), FOR PROVIDER SLIDE REVIEW

## 2019-08-06 NOTE — Progress Notes (Signed)
Hematology and Oncology Follow Up Visit  Shari Murray [id number] 1985-12-15 33 y.o. 08/06/2019   Principle Diagnosis:  Hgb E - beta thalassemia Iron deficiency anemia   Current Therapy:   Folic acid 1 mg PO daily Fusion Plus I po q day   Interim History:  Shari Murray is here today for follow-up. She is doing well and has no complaints at this time.  She is alternating her iron and folic acid supplements because when taken together she was having frequent BM's.  No fever, chills, n/v, cough, rash, dizziness, SOB, chest pain, palpitations, abdominal pain or changes in bowel or bladder habits.  No swelling, tenderness, numbness or tingling in her extremities.  She has maintained a good appetite and is staying well hydrated. Her weight is stable.   ECOG Performance Status: 1 - Symptomatic but completely ambulatory  Medications:  Allergies as of 08/06/2019      Reactions   Shellfish Allergy Hives, Itching      Medication List       Accurate as of August 06, 2019  3:44 PM. If you have any questions, ask your nurse or doctor.        folic acid 1 MG tablet Commonly known as: FOLVITE Take 1 tablet (1 mg total) by mouth daily.   Fusion Plus Caps Take 1 tablet by mouth daily.   ibuprofen 600 MG tablet Commonly known as: ADVIL Take 1 tablet (600 mg total) by mouth every 6 (six) hours.   nitrofurantoin (macrocrystal-monohydrate) 100 MG capsule Commonly known as: MACROBID Take 100 mg by mouth 2 (two) times daily.   omeprazole 20 MG capsule Commonly known as: PriLOSEC Take 1 capsule (20 mg total) 2 (two) times daily before a meal by mouth.       Allergies:  Allergies  Allergen Reactions  . Shellfish Allergy Hives and Itching    Past Medical History, Surgical history, Social history, and Family History were reviewed and updated.  Review of Systems: All other 10 point review of systems is negative.   Physical Exam:  weight is 118 lb (53.5 kg). Her  temporal temperature is 97.5 F (36.4 C) (abnormal). Her blood pressure is 98/58 (abnormal) and her pulse is 71. Her respiration is 17 and oxygen saturation is 100%.   Wt Readings from Last 3 Encounters:  08/06/19 118 lb (53.5 kg)  02/02/19 118 lb 12.8 oz (53.9 kg)  10/04/18 122 lb (55.3 kg)    Ocular: Sclerae unicteric, pupils equal, round and reactive to light Ear-nose-throat: Oropharynx clear, dentition fair Lymphatic: No cervical or supraclavicular adenopathy Lungs no rales or rhonchi, good excursion bilaterally Heart regular rate and rhythm, no murmur appreciated Abd soft, nontender, positive bowel sounds, no liver or spleen tip palpated on exam, no fluid wave  MSK no focal spinal tenderness, no joint edema Neuro: non-focal, well-oriented, appropriate affect Breasts: Deferred   Lab Results  Component Value Date   WBC 15.7 (H) 08/06/2019   HGB 11.7 (L) 08/06/2019   HCT 35.6 (L) 08/06/2019   MCV 59.2 (L) 08/06/2019   PLT 362 08/06/2019   Lab Results  Component Value Date   FERRITIN 45 02/02/2019   IRON 151 (H) 02/02/2019   TIBC 330 02/02/2019   UIBC 179 02/02/2019   IRONPCTSAT 46 02/02/2019   Lab Results  Component Value Date   RETICCTPCT 1.7 08/06/2019   RBC 6.01 (H) 08/06/2019   RBC 5.86 (H) 08/06/2019   RETICCTABS 135.4 02/16/2013   No results found for: KPAFRELGTCHN, LAMBDASER, KAPLAMBRATIO  No results found for: IGGSERUM, IGA, IGMSERUM No results found for: Odetta Pink, SPEI   Chemistry      Component Value Date/Time   NA 139 10/04/2018 1454   NA 141 06/24/2017 1108   K 3.5 10/04/2018 1454   K 3.8 06/24/2017 1108   CL 103 10/04/2018 1454   CL 107 06/24/2017 1108   CO2 27 10/04/2018 1454   CO2 27 06/24/2017 1108   BUN 6 10/04/2018 1454   BUN 4 (L) 06/24/2017 1108   CREATININE 0.65 10/04/2018 1454   CREATININE 0.5 (L) 06/24/2017 1108      Component Value Date/Time   CALCIUM 9.5 10/04/2018 1454    CALCIUM 9.5 06/24/2017 1108   ALKPHOS 72 10/04/2018 1454   ALKPHOS 61 06/24/2017 1108   AST 20 10/04/2018 1454   ALT 26 10/04/2018 1454   ALT 22 06/24/2017 1108   BILITOT 0.4 10/04/2018 1454       Impression and Plan: Shari Murray is a very pleasant 33 yo [name] with Hgb E beta thalassemia as well as iron deficiency anemia. She is doing well and will continue to alternate her folic acid and iron supplements.  We will plan to see her back in another 8 months for follow-up.  She will contact our office with any questions or concerns. We can certainly see her sooner if needed.   Laverna Peace, NP 11/23/20203:44 PM

## 2019-08-07 LAB — IRON AND TIBC
Iron: 80 ug/dL (ref 41–142)
Saturation Ratios: 21 % (ref 21–57)
TIBC: 376 ug/dL (ref 236–444)
UIBC: 296 ug/dL (ref 120–384)

## 2019-08-07 LAB — FERRITIN: Ferritin: 67 ng/mL (ref 11–307)

## 2019-08-08 ENCOUNTER — Telehealth: Payer: Self-pay | Admitting: Hematology & Oncology

## 2019-08-08 NOTE — Telephone Encounter (Signed)
Appointments scheduled letter/calendar mailed per 11/23 los

## 2019-09-19 ENCOUNTER — Other Ambulatory Visit: Payer: Self-pay | Admitting: Family

## 2020-04-04 ENCOUNTER — Inpatient Hospital Stay: Payer: Commercial Managed Care - PPO | Admitting: Hematology & Oncology

## 2020-04-04 ENCOUNTER — Inpatient Hospital Stay: Payer: Commercial Managed Care - PPO

## 2020-04-15 ENCOUNTER — Encounter: Payer: Self-pay | Admitting: Family

## 2020-04-15 ENCOUNTER — Inpatient Hospital Stay (HOSPITAL_BASED_OUTPATIENT_CLINIC_OR_DEPARTMENT_OTHER): Payer: Commercial Managed Care - PPO | Admitting: Family

## 2020-04-15 ENCOUNTER — Inpatient Hospital Stay: Payer: Commercial Managed Care - PPO | Attending: Hematology & Oncology

## 2020-04-15 VITALS — BP 106/61 | HR 70 | Temp 98.4°F | Ht <= 58 in | Wt 125.4 lb

## 2020-04-15 DIAGNOSIS — D508 Other iron deficiency anemias: Secondary | ICD-10-CM

## 2020-04-15 DIAGNOSIS — D509 Iron deficiency anemia, unspecified: Secondary | ICD-10-CM | POA: Insufficient documentation

## 2020-04-15 DIAGNOSIS — D561 Beta thalassemia: Secondary | ICD-10-CM | POA: Diagnosis present

## 2020-04-15 DIAGNOSIS — D565 Hemoglobin E-beta thalassemia: Secondary | ICD-10-CM

## 2020-04-15 LAB — IRON AND TIBC
Iron: 75 ug/dL (ref 41–142)
Saturation Ratios: 22 % (ref 21–57)
TIBC: 343 ug/dL (ref 236–444)
UIBC: 267 ug/dL (ref 120–384)

## 2020-04-15 LAB — CBC WITH DIFFERENTIAL (CANCER CENTER ONLY)
Abs Immature Granulocytes: 0.24 10*3/uL — ABNORMAL HIGH (ref 0.00–0.07)
Basophils Absolute: 0.1 10*3/uL (ref 0.0–0.1)
Basophils Relative: 1 %
Eosinophils Absolute: 0.3 10*3/uL (ref 0.0–0.5)
Eosinophils Relative: 2 %
HCT: 34.9 % — ABNORMAL LOW (ref 36.0–46.0)
Hemoglobin: 11.7 g/dL — ABNORMAL LOW (ref 12.0–15.0)
Immature Granulocytes: 2 %
Lymphocytes Relative: 22 %
Lymphs Abs: 3.2 10*3/uL (ref 0.7–4.0)
MCH: 19.9 pg — ABNORMAL LOW (ref 26.0–34.0)
MCHC: 33.5 g/dL (ref 30.0–36.0)
MCV: 59.3 fL — ABNORMAL LOW (ref 80.0–100.0)
Monocytes Absolute: 0.9 10*3/uL (ref 0.1–1.0)
Monocytes Relative: 6 %
Neutro Abs: 10.1 10*3/uL — ABNORMAL HIGH (ref 1.7–7.7)
Neutrophils Relative %: 67 %
Platelet Count: 376 10*3/uL (ref 150–400)
RBC: 5.89 MIL/uL — ABNORMAL HIGH (ref 3.87–5.11)
RDW: 17 % — ABNORMAL HIGH (ref 11.5–15.5)
WBC Count: 14.8 10*3/uL — ABNORMAL HIGH (ref 4.0–10.5)
nRBC: 0 % (ref 0.0–0.2)

## 2020-04-15 LAB — RETICULOCYTES
Immature Retic Fract: 18 % — ABNORMAL HIGH (ref 2.3–15.9)
RBC.: 5.85 MIL/uL — ABNORMAL HIGH (ref 3.87–5.11)
Retic Count, Absolute: 84.2 10*3/uL (ref 19.0–186.0)
Retic Ct Pct: 1.4 % (ref 0.4–3.1)

## 2020-04-15 LAB — FERRITIN: Ferritin: 58 ng/mL (ref 11–307)

## 2020-04-15 NOTE — Progress Notes (Signed)
Hematology and Oncology Follow Up Visit  Shari Murray [id number] [birth year] 33 y.o. 04/15/2020   Principle Diagnosis:  Hgb E - beta thalassemia Iron deficiency anemia  Current Therapy:        Folic acid 1 mg PO daily- not taking per patient Fusion Plus I po q day - not taking per patient    Interim History:  Shari Murray is here today for follow-up. She is doing well and has no complaints at this time.  No issues with blood loss. No bruising or petechiae.  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  She is still smoking 1 ppd.  No fever, chills, n/v, cough, rash, dizziness, SOB, chest pain, palpitations, abdominal pain or changes in bowel or bladder habits.  No swelling, tenderness, numbness or tingling in her extremities.  No falls or syncope.  She has maintained a a good appetite and is staying well hydrated. Her weight is stable.   ECOG Performance Status: 1 - Symptomatic but completely ambulatory  Medications:  Allergies as of 04/15/2020      Reactions   Shellfish Allergy Hives, Itching      Medication List       Accurate as of April 15, 2020  9:01 AM. If you have any questions, ask your nurse or doctor.        STOP taking these medications   nitrofurantoin (macrocrystal-monohydrate) 100 MG capsule Commonly known as: MACROBID Stopped by: Laverna Peace, NP     TAKE these medications   folic acid 1 MG tablet Commonly known as: FOLVITE Take 1 tablet (1 mg total) by mouth daily.   Fusion Plus Caps Take 1 tablet by mouth daily.   ibuprofen 600 MG tablet Commonly known as: ADVIL Take 1 tablet (600 mg total) by mouth every 6 (six) hours.   omeprazole 20 MG capsule Commonly known as: PriLOSEC Take 1 capsule (20 mg total) 2 (two) times daily before a meal by mouth.       Allergies:  Allergies  Allergen Reactions  . Shellfish Allergy Hives and Itching    Past Medical History, Surgical history, Social history, and  Family History were reviewed and updated.  Review of Systems: All other 10 point review of systems is negative.   Physical Exam:  vitals were not taken for this visit.   Wt Readings from Last 3 Encounters:  08/06/19 118 lb (53.5 kg)  02/02/19 118 lb 12.8 oz (53.9 kg)  10/04/18 122 lb (55.3 kg)    Ocular: Sclerae unicteric, pupils equal, round and reactive to light Ear-nose-throat: Oropharynx clear, dentition fair Lymphatic: No cervical or supraclavicular adenopathy Lungs no rales or rhonchi, good excursion bilaterally Heart regular rate and rhythm, no murmur appreciated Abd soft, nontender, positive bowel sounds, no liver or spleen tip palpated on exam, no fluid wave  MSK no focal spinal tenderness, no joint edema Neuro: non-focal, well-oriented, appropriate affect Breasts: Deferred   Lab Results  Component Value Date   WBC 14.8 (H) 04/15/2020   HGB 11.7 (L) 04/15/2020   HCT 34.9 (L) 04/15/2020   MCV 59.3 (L) 04/15/2020   PLT 376 04/15/2020   Lab Results  Component Value Date   FERRITIN 67 08/06/2019   IRON 80 08/06/2019   TIBC 376 08/06/2019   UIBC 296 08/06/2019   IRONPCTSAT 21 08/06/2019   Lab Results  Component Value Date   RETICCTPCT 1.4 04/15/2020   RBC 5.89 (H) 04/15/2020   RBC 5.85 (H) 04/15/2020  RETICCTABS 135.4 02/16/2013   No results found for: KPAFRELGTCHN, LAMBDASER, KAPLAMBRATIO No results found for: IGGSERUM, IGA, IGMSERUM No results found for: Odetta Pink, SPEI   Chemistry      Component Value Date/Time   NA 139 10/04/2018 1454   NA 141 06/24/2017 1108   K 3.5 10/04/2018 1454   K 3.8 06/24/2017 1108   CL 103 10/04/2018 1454   CL 107 06/24/2017 1108   CO2 27 10/04/2018 1454   CO2 27 06/24/2017 1108   BUN 6 10/04/2018 1454   BUN 4 (L) 06/24/2017 1108   CREATININE 0.65 10/04/2018 1454   CREATININE 0.5 (L) 06/24/2017 1108      Component Value Date/Time   CALCIUM 9.5 10/04/2018 1454     CALCIUM 9.5 06/24/2017 1108   ALKPHOS 72 10/04/2018 1454   ALKPHOS 61 06/24/2017 1108   AST 20 10/04/2018 1454   ALT 26 10/04/2018 1454   ALT 22 06/24/2017 1108   BILITOT 0.4 10/04/2018 1454       Impression and Plan: Shari Murray is a very pleasant 34 yo [name] with Hgb E beta thalassemia as well as iron deficiency anemia. She continues to do well and has no complaints at this time.  She has not been taking her iron or folic acid. We encourage her to start taking them again daily.  We will plan to see her again in another year. She can contact our office with any questions or concerns.  Laverna Peace, NP 8/3/20219:01 AM

## 2020-04-25 ENCOUNTER — Other Ambulatory Visit: Payer: Self-pay | Admitting: Hematology & Oncology

## 2020-10-14 ENCOUNTER — Encounter: Payer: Self-pay | Admitting: Obstetrics

## 2020-10-14 ENCOUNTER — Other Ambulatory Visit (HOSPITAL_COMMUNITY)
Admission: RE | Admit: 2020-10-14 | Discharge: 2020-10-14 | Disposition: A | Discharge status: Home / Self Care | Payer: Commercial Managed Care - PPO | Source: Ambulatory Visit | Attending: Obstetrics | Admitting: Obstetrics

## 2020-10-14 ENCOUNTER — Ambulatory Visit (INDEPENDENT_AMBULATORY_CARE_PROVIDER_SITE_OTHER): Payer: Commercial Managed Care - PPO | Admitting: Obstetrics

## 2020-10-14 ENCOUNTER — Other Ambulatory Visit: Payer: Self-pay

## 2020-10-14 VITALS — BP 114/73 | HR 68 | Ht <= 58 in | Wt 122.7 lb

## 2020-10-14 DIAGNOSIS — N898 Other specified noninflammatory disorders of vagina: Secondary | ICD-10-CM | POA: Diagnosis not present

## 2020-10-14 DIAGNOSIS — Z0000 Encounter for general adult medical examination without abnormal findings: Secondary | ICD-10-CM

## 2020-10-14 DIAGNOSIS — Z01419 Encounter for gynecological examination (general) (routine) without abnormal findings: Secondary | ICD-10-CM | POA: Insufficient documentation

## 2020-10-14 DIAGNOSIS — F17200 Nicotine dependence, unspecified, uncomplicated: Secondary | ICD-10-CM

## 2020-10-14 DIAGNOSIS — Z3009 Encounter for other general counseling and advice on contraception: Secondary | ICD-10-CM

## 2020-10-14 NOTE — Progress Notes (Signed)
Patient presents for AEX. Patient has no concerns. Patient would like to have Lipid Panel checked.   Last pap: 2019 Normal done at [facility]

## 2020-10-14 NOTE — Progress Notes (Signed)
Subjective:        Shari Murray is a 35 y.o. female here for a routine exam.  Current complaints: Vaginal discharge.    Personal health questionnaire:  Is patient Shari Murray, have a family history of breast and/or ovarian cancer: no Is there a family history of uterine cancer diagnosed at age < 65, gastrointestinal cancer, urinary tract cancer, family member who is a Field seismologist syndrome-associated carrier: no Is the patient overweight and hypertensive, family history of diabetes, personal history of gestational diabetes, preeclampsia or PCOS: no Is patient over 29, have PCOS,  family history of premature CHD under age 22, diabetes, smoke, have hypertension or peripheral artery disease:  no At any time, has a partner hit, kicked or otherwise hurt or frightened you?: no Over the past 2 weeks, have you felt down, depressed or hopeless?: no Over the past 2 weeks, have you felt little interest or pleasure in doing things?:no   Gynecologic History Patient's last menstrual period was 09/16/2020. Contraception: none Last Pap: 2019. Results were: normal Last mammogram: n/a. Results were: abnormal  Obstetric History OB History  Gravida Para Term Preterm AB Living  3 3 3     3   SAB IAB Ectopic Multiple Live Births        0 3    # Outcome Date GA Lbr Len/2nd Weight Sex Delivery Anes PTL Lv  3 Term 11/28/17 [redacted]w[redacted]d 06:41 / 00:52 6 lb 11 oz (3.033 kg) F Vag-Spont EPI  LIV  2 Term 04/09/13 [redacted]w[redacted]d 27:40 / 01:10 5 lb 8.7 oz (2.515 kg) F Vag-Spont EPI  LIV     Birth Comments: WNL  1 Term 03/15/04 [redacted]w[redacted]d 36:00 8 lb (3.629 kg) M Vag-Spont EPI N LIV     Birth Comments: Pt suffered with severe anemia during pregnancy    Past Medical History:  Diagnosis Date  . Anemia    during pregnancy  . RUEAVWUJ(811.9)     Past Surgical History:  Procedure Laterality Date  . NO PAST SURGERIES    . WISDOM TOOTH EXTRACTION  08/2016     Current Outpatient Medications:  .  ibuprofen (ADVIL,MOTRIN)  600 MG tablet, Take 1 tablet (600 mg total) by mouth every 6 (six) hours., Disp: 60 tablet, Rfl: 0 .  Iron-FA-B Cmp-C-Biot-Probiotic (FUSION PLUS) CAPS, Take 1 capsule by mouth once daily, Disp: 30 capsule, Rfl: 0  Current Facility-Administered Medications:  .  acetaminophen (TYLENOL) tablet 975 mg, 975 mg, Oral, Once, Copland, Gay Filler, MD Allergies  Allergen Reactions  . Shellfish Allergy Hives and Itching    Social History   Tobacco Use  . Smoking status: Current Every Day Smoker    Packs/day: 0.75    Types: Cigarettes    Start date: 12/10/1993  . Smokeless tobacco: Never Used  Substance Use Topics  . Alcohol use: No    Family History  Problem Relation Age of Onset  . Hypertension Father       Review of Systems  Constitutional: negative for fatigue and weight loss Respiratory: negative for cough and wheezing Cardiovascular: negative for chest pain, fatigue and palpitations Gastrointestinal: negative for abdominal pain and change in bowel habits Musculoskeletal:negative for myalgias Neurological: negative for gait problems and tremors Behavioral/Psych: negative for abusive relationship, depression Endocrine: negative for temperature intolerance    Genitourinary:negative for abnormal menstrual periods, genital lesions, hot flashes, sexual problems.       Positive for vaginal discharge Integument/breast: negative for breast lump, breast tenderness, nipple discharge and skin lesion(s)  Objective:       BP 114/73   Pulse 68   Ht 4\' 9"  (1.448 m)   Wt 122 lb 11.2 oz (55.7 kg)   LMP 09/16/2020   BMI 26.55 kg/m  General:   alert and no distress  Skin:   no rash or abnormalities  Lungs:   clear to auscultation bilaterally  Heart:   regular rate and rhythm, S1, S2 normal, no murmur, click, rub or gallop  Breasts:   normal without suspicious masses, skin or nipple changes or axillary nodes  Abdomen:  normal findings: no organomegaly, soft, non-tender and no hernia   Pelvis:  External genitalia: normal general appearance Urinary system: urethral meatus normal and bladder without fullness, nontender Vaginal: normal without tenderness, induration or masses Cervix: normal appearance Adnexa: normal bimanual exam Uterus: anteverted and non-tender, normal size   Lab Review Urine pregnancy test Labs reviewed yes Radiologic studies reviewed no  50% of 20 min visit spent on counseling and coordination of care.   Assessment:     1. Encounter for gynecological examination with Papanicolaou smear of cervix Rx: - Cytology - PAP( [facility])  2. Vaginal discharge Rx: - Cervicovaginal ancillary only( [facility])  3. Routine adult health maintenance - has strong family history of hypercholesterolemia  Rx: - Lipid panel  4. Encounter for counseling regarding contraception - declines contraception.  Considering having another baby.  Healthy eating and living and taking folic acid recommended  5. Tobacco dependence - cessation with the aid of medication and behavioral modification recommended    Plan:    Education reviewed: calcium supplements, depression evaluation, low fat, low cholesterol diet, safe sex/STD prevention, self breast exams and smoking cessation. Contraception: none. Follow up in: 1 year.    Orders Placed This Encounter  Procedures  . Lipid panel    Shelly Bombard, MD 10/14/2020 11:23 AM

## 2020-10-15 ENCOUNTER — Telehealth: Payer: Self-pay | Admitting: *Deleted

## 2020-10-15 LAB — LIPID PANEL
Chol/HDL Ratio: 3.9 ratio (ref 0.0–4.4)
Cholesterol, Total: 132 mg/dL (ref 100–199)
HDL: 34 mg/dL — ABNORMAL LOW (ref >39)
LDL Chol Calc (NIH): 70 mg/dL (ref 0–99)
Triglycerides: 162 mg/dL — ABNORMAL HIGH (ref 0–149)
VLDL Cholesterol Cal: 28 mg/dL (ref 5–40)

## 2020-10-15 LAB — CYTOLOGY - PAP
Comment: NEGATIVE
Diagnosis: NEGATIVE
High risk HPV: NEGATIVE

## 2020-10-15 LAB — CERVICOVAGINAL ANCILLARY ONLY
Bacterial Vaginitis (gardnerella): NEGATIVE
Candida Glabrata: NEGATIVE
Candida Vaginitis: NEGATIVE
Chlamydia: NEGATIVE
Comment: NEGATIVE
Comment: NEGATIVE
Comment: NEGATIVE
Comment: NEGATIVE
Comment: NEGATIVE
Comment: NORMAL
Neisseria Gonorrhea: NEGATIVE
Trichomonas: NEGATIVE

## 2020-10-15 NOTE — Telephone Encounter (Signed)
Pt does not have her vaccination card with her. Pt received moderna vaccines and moderna booster at [facility]

## 2021-04-14 ENCOUNTER — Other Ambulatory Visit: Payer: Self-pay | Admitting: Family

## 2021-04-14 DIAGNOSIS — D508 Other iron deficiency anemias: Secondary | ICD-10-CM

## 2021-04-14 DIAGNOSIS — D565 Hemoglobin E-beta thalassemia: Secondary | ICD-10-CM

## 2021-04-15 ENCOUNTER — Other Ambulatory Visit: Payer: Self-pay

## 2021-04-15 ENCOUNTER — Inpatient Hospital Stay: Payer: Commercial Managed Care - PPO

## 2021-04-15 ENCOUNTER — Encounter: Payer: Self-pay | Admitting: Family

## 2021-04-15 ENCOUNTER — Inpatient Hospital Stay: Payer: Commercial Managed Care - PPO | Attending: Family | Admitting: Family

## 2021-04-15 VITALS — BP 100/64 | HR 75 | Temp 98.0°F | Resp 17 | Wt 126.0 lb

## 2021-04-15 DIAGNOSIS — R5383 Other fatigue: Secondary | ICD-10-CM | POA: Insufficient documentation

## 2021-04-15 DIAGNOSIS — D508 Other iron deficiency anemias: Secondary | ICD-10-CM

## 2021-04-15 DIAGNOSIS — D509 Iron deficiency anemia, unspecified: Secondary | ICD-10-CM | POA: Insufficient documentation

## 2021-04-15 DIAGNOSIS — D565 Hemoglobin E-beta thalassemia: Secondary | ICD-10-CM | POA: Insufficient documentation

## 2021-04-15 LAB — CBC WITH DIFFERENTIAL (CANCER CENTER ONLY)
Abs Immature Granulocytes: 0.08 10*3/uL — ABNORMAL HIGH (ref 0.00–0.07)
Basophils Absolute: 0.1 10*3/uL (ref 0.0–0.1)
Basophils Relative: 1 %
Eosinophils Absolute: 0.8 10*3/uL — ABNORMAL HIGH (ref 0.0–0.5)
Eosinophils Relative: 5 %
HCT: 34.6 % — ABNORMAL LOW (ref 36.0–46.0)
Hemoglobin: 11.6 g/dL — ABNORMAL LOW (ref 12.0–15.0)
Immature Granulocytes: 1 %
Lymphocytes Relative: 20 %
Lymphs Abs: 3.3 10*3/uL (ref 0.7–4.0)
MCH: 19.8 pg — ABNORMAL LOW (ref 26.0–34.0)
MCHC: 33.5 g/dL (ref 30.0–36.0)
MCV: 59.1 fL — ABNORMAL LOW (ref 80.0–100.0)
Monocytes Absolute: 1.5 10*3/uL — ABNORMAL HIGH (ref 0.1–1.0)
Monocytes Relative: 9 %
Neutro Abs: 11.2 10*3/uL — ABNORMAL HIGH (ref 1.7–7.7)
Neutrophils Relative %: 64 %
Platelet Count: 352 10*3/uL (ref 150–400)
RBC: 5.85 MIL/uL — ABNORMAL HIGH (ref 3.87–5.11)
RDW: 17 % — ABNORMAL HIGH (ref 11.5–15.5)
WBC Count: 17 10*3/uL — ABNORMAL HIGH (ref 4.0–10.5)
nRBC: 0 % (ref 0.0–0.2)

## 2021-04-15 LAB — CMP (CANCER CENTER ONLY)
ALT: 21 U/L (ref 0–44)
AST: 18 U/L (ref 15–41)
Albumin: 4.3 g/dL (ref 3.5–5.0)
Alkaline Phosphatase: 64 U/L (ref 38–126)
Anion gap: 7 (ref 5–15)
BUN: 11 mg/dL (ref 6–20)
CO2: 25 mmol/L (ref 22–32)
Calcium: 9 mg/dL (ref 8.9–10.3)
Chloride: 105 mmol/L (ref 98–111)
Creatinine: 0.67 mg/dL (ref 0.44–1.00)
GFR, Estimated: >60 mL/min (ref >60)
Glucose, Bld: 150 mg/dL — ABNORMAL HIGH (ref 70–99)
Potassium: 4.2 mmol/L (ref 3.5–5.1)
Sodium: 137 mmol/L (ref 135–145)
Total Bilirubin: 0.4 mg/dL (ref 0.3–1.2)
Total Protein: 6.9 g/dL (ref 6.5–8.1)

## 2021-04-15 LAB — RETICULOCYTES
Immature Retic Fract: 26 % — ABNORMAL HIGH (ref 2.3–15.9)
RBC.: 5.86 MIL/uL — ABNORMAL HIGH (ref 3.87–5.11)
Retic Count, Absolute: 111.9 10*3/uL (ref 19.0–186.0)
Retic Ct Pct: 1.9 % (ref 0.4–3.1)

## 2021-04-15 MED ORDER — FUSION PLUS PO CAPS: 1.0000 | ORAL_CAPSULE | Freq: Every day | ORAL | 11 refills | Status: DC

## 2021-04-15 NOTE — Progress Notes (Signed)
Hematology and Oncology Follow Up Visit  Shari Murray [id number] 1986/05/10 35 y.o. 04/15/2021   Principle Diagnosis:  Hgb E - beta thalassemia  Iron deficiency anemia    Current Therapy:        Folic acid 1 mg PO daily - not taking per patient Fusion Plus I po q day - not taking per patient    Interim History:  Shari Murray is here today for follow-up. She is doing well but notes fatigue at times.  She is taking her fusion plus supplement daily and tolerating it nicely.  She states that her cycle is regular with normal flow. No other blood loss noted. No bruising or petechiae.  No fever, chills, n/v, cough, rash, dizziness, SOB, chest pain, palpitations, abdominal pain or changes in bowel or bladder habits.  No swelling, tenderness, numbness or tingling in her extremities.  No falls or syncope to report.  She has maintained a good appetite and is staying well hydrated. Her weight is stable at 126 lbs.   ECOG Performance Status: 1 - Symptomatic but completely ambulatory  Medications:  Allergies as of 04/15/2021       Reactions   Shellfish Allergy Hives, Itching        Medication List        Accurate as of April 15, 2021  9:23 AM. If you have any questions, ask your nurse or doctor.          Fusion Plus Caps Take 1 capsule by mouth once daily   ibuprofen 600 MG tablet Commonly known as: ADVIL Take 1 tablet (600 mg total) by mouth every 6 (six) hours.        Allergies:  Allergies  Allergen Reactions   Shellfish Allergy Hives and Itching    Past Medical History, Surgical history, Social history, and Family History were reviewed and updated.  Review of Systems: All other 10 point review of systems is negative.   Physical Exam:  weight is 126 lb (57.2 kg). Her oral temperature is 98 F (36.7 C). Her blood pressure is 100/64 and her pulse is 75. Her respiration is 17 and oxygen saturation is 100%.   Wt Readings from Last 3 Encounters:  04/15/21  126 lb (57.2 kg)  10/14/20 122 lb 11.2 oz (55.7 kg)  04/15/20 125 lb 6.4 oz (56.9 kg)    Ocular: Sclerae unicteric, pupils equal, round and reactive to light Ear-nose-throat: Oropharynx clear, dentition fair Lymphatic: No cervical or supraclavicular adenopathy Lungs no rales or rhonchi, good excursion bilaterally Heart regular rate and rhythm, no murmur appreciated Abd soft, nontender, positive bowel sounds MSK no focal spinal tenderness, no joint edema Neuro: non-focal, well-oriented, appropriate affect Breasts: Deferred   Lab Results  Component Value Date   WBC 17.0 (H) 04/15/2021   HGB 11.6 (L) 04/15/2021   HCT 34.6 (L) 04/15/2021   MCV 59.1 (L) 04/15/2021   PLT 352 04/15/2021   Lab Results  Component Value Date   FERRITIN 58 04/15/2020   IRON 75 04/15/2020   TIBC 343 04/15/2020   UIBC 267 04/15/2020   IRONPCTSAT 22 04/15/2020   Lab Results  Component Value Date   RETICCTPCT 1.9 04/15/2021   RBC 5.85 (H) 04/15/2021   RBC 5.86 (H) 04/15/2021   RETICCTABS 135.4 02/16/2013   No results found for: KPAFRELGTCHN, LAMBDASER, KAPLAMBRATIO No results found for: IGGSERUM, IGA, IGMSERUM No results found for: TOTALPROTELP, ALBUMINELP, A1GS, A2GS, BETS, BETA2SER, GAMS, MSPIKE, SPEI   Chemistry      Component Value  Date/Time   NA 137 04/15/2021 0844   NA 141 06/24/2017 1108   K 4.2 04/15/2021 0844   K 3.8 06/24/2017 1108   CL 105 04/15/2021 0844   CL 107 06/24/2017 1108   CO2 25 04/15/2021 0844   CO2 27 06/24/2017 1108   BUN 11 04/15/2021 0844   BUN 4 (L) 06/24/2017 1108   CREATININE 0.67 04/15/2021 0844   CREATININE 0.5 (L) 06/24/2017 1108      Component Value Date/Time   CALCIUM 9.0 04/15/2021 0844   CALCIUM 9.5 06/24/2017 1108   ALKPHOS 64 04/15/2021 0844   ALKPHOS 61 06/24/2017 1108   AST 18 04/15/2021 0844   ALT 21 04/15/2021 0844   ALT 22 06/24/2017 1108   BILITOT 0.4 04/15/2021 0844       Impression and Plan: Shari Murray is a very pleasant 35 yo  [name] with Hgb E beta thalassemia as well as iron deficiency anemia.  She will continue her daily fusion plus supplement.  Follow-up in 1 year.  She can contact our office with any questions or concerns.   Laverna Peace, NP 8/3/20229:23 AM

## 2021-04-16 LAB — IRON AND TIBC
Iron: 43 ug/dL (ref 41–142)
Saturation Ratios: 12 % — ABNORMAL LOW (ref 21–57)
TIBC: 353 ug/dL (ref 236–444)
UIBC: 309 ug/dL (ref 120–384)

## 2021-04-16 LAB — FERRITIN: Ferritin: 64 ng/mL (ref 11–307)

## 2022-04-16 ENCOUNTER — Inpatient Hospital Stay: Payer: Commercial Managed Care - PPO

## 2022-04-16 ENCOUNTER — Other Ambulatory Visit: Payer: Self-pay | Admitting: Family

## 2022-04-16 ENCOUNTER — Inpatient Hospital Stay: Payer: Commercial Managed Care - PPO | Admitting: Family

## 2022-04-16 DIAGNOSIS — D508 Other iron deficiency anemias: Secondary | ICD-10-CM

## 2022-04-16 DIAGNOSIS — D565 Hemoglobin E-beta thalassemia: Secondary | ICD-10-CM

## 2022-04-26 ENCOUNTER — Encounter: Payer: Self-pay | Admitting: Family

## 2022-05-04 ENCOUNTER — Encounter: Payer: Self-pay | Admitting: Family

## 2022-11-04 DIAGNOSIS — Z0000 Encounter for general adult medical examination without abnormal findings: Secondary | ICD-10-CM | POA: Diagnosis not present

## 2022-11-09 DIAGNOSIS — H04123 Dry eye syndrome of bilateral lacrimal glands: Secondary | ICD-10-CM | POA: Diagnosis not present

## 2022-11-09 DIAGNOSIS — R519 Headache, unspecified: Secondary | ICD-10-CM | POA: Diagnosis not present

## 2023-01-04 ENCOUNTER — Encounter: Payer: Self-pay | Admitting: Family

## 2023-01-18 ENCOUNTER — Encounter: Payer: Self-pay | Admitting: Family

## 2023-05-24 DIAGNOSIS — R300 Dysuria: Secondary | ICD-10-CM | POA: Diagnosis not present

## 2023-09-13 ENCOUNTER — Encounter: Payer: Self-pay | Admitting: Family

## 2023-09-13 ENCOUNTER — Encounter (HOSPITAL_COMMUNITY): Payer: Self-pay | Admitting: Emergency Medicine

## 2023-09-13 ENCOUNTER — Ambulatory Visit (HOSPITAL_COMMUNITY)
Admission: EM | Admit: 2023-09-13 | Discharge: 2023-09-13 | Disposition: A | Discharge status: Home / Self Care | Payer: 59 | Attending: Family Medicine | Admitting: Family Medicine

## 2023-09-13 DIAGNOSIS — J029 Acute pharyngitis, unspecified: Secondary | ICD-10-CM

## 2023-09-13 DIAGNOSIS — J209 Acute bronchitis, unspecified: Secondary | ICD-10-CM | POA: Diagnosis not present

## 2023-09-13 DIAGNOSIS — R051 Acute cough: Secondary | ICD-10-CM

## 2023-09-13 LAB — POCT RAPID STREP A (OFFICE): Rapid Strep A Screen: NEGATIVE

## 2023-09-13 MED ORDER — METHYLPREDNISOLONE ACETATE 80 MG/ML IJ SUSP
80.0000 mg | Freq: Once | INTRAMUSCULAR | Status: AC
  Administered 2023-09-13: 80 mg via INTRAMUSCULAR

## 2023-09-13 MED ORDER — METHYLPREDNISOLONE ACETATE 80 MG/ML IJ SUSP
INTRAMUSCULAR | Status: AC
  Filled 2023-09-13: qty 1

## 2023-09-13 MED ORDER — AEROCHAMBER PLUS FLO-VU MISC
1.0000 | Freq: Once | Status: AC
  Administered 2023-09-13: 1

## 2023-09-13 MED ORDER — ALBUTEROL SULFATE HFA 108 (90 BASE) MCG/ACT IN AERS
2.0000 | INHALATION_SPRAY | Freq: Once | RESPIRATORY_TRACT | Status: AC
  Administered 2023-09-13: 2 via RESPIRATORY_TRACT

## 2023-09-13 MED ORDER — PROMETHAZINE-DM 6.25-15 MG/5ML PO SYRP: 5.0000 mL | ORAL_SOLUTION | Freq: Four times a day (QID) | ORAL | 0 refills | Status: DC | PRN

## 2023-09-13 MED ORDER — AEROCHAMBER PLUS FLO-VU LARGE MISC
Status: AC
  Filled 2023-09-13: qty 1

## 2023-09-13 MED ORDER — ALBUTEROL SULFATE HFA 108 (90 BASE) MCG/ACT IN AERS
INHALATION_SPRAY | RESPIRATORY_TRACT | Status: AC
  Filled 2023-09-13: qty 6.7

## 2023-09-13 NOTE — ED Provider Notes (Signed)
 [facility]    CSN: [id number] Arrival date & time: 09/13/23  1703      History   Chief Complaint Chief Complaint  Patient presents with   Cough    HPI Shari Murray is a 37 y.o. female.   Patient reports cough and head/chest congestion for 2 weeks or longer.  At this time she has minimal nasal discharge mostly white, but sometimes slightly yellow or green.  She sometimes coughs up some sputum.  She reports that her coughing is worse at night when she tries to sleep and she sometimes wheezes if she is coughing a lot.  She did have some fever initially but that was 10 days or more ago.  She has tried some over-the-counter decongestants and NyQuil but neither are helping her get rid of the cough.  She is not sleeping well at all and wants help.  She also has some intermittent soreness in her chest from coughing so hard and has some throat pain but thinks that that is secondary to all the coughing.   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 Authorizing Provider   promethazine -dextromethorphan (PROMETHAZINE -DM) 6.25-15 MG/5ML syrup Take 5 mLs by mouth 4 (four) times daily as needed for cough. 09/13/23  Yes Ival Domino, FNP  ibuprofen  (ADVIL ,MOTRIN ) 600 MG tablet Take 1 tablet (600 mg total) by mouth every 6 (six) hours. 11/29/17   Degele, Mliss SQUIBB, MD  Iron-FA-B Cmp-C-Biot-Probiotic (FUSION PLUS) CAPS Take 1 capsule by mouth daily. 04/15/21   Franchot Domino HERO, NP    Family History Family History  Problem Relation Age of Onset   Hypertension Father     Social History Social History   Tobacco Use   Smoking status: Every Day    Current packs/day: 0.75    Average packs/day: 0.8 packs/day for 29.8 years (22.3 ttl pk-yrs)    Types: Cigarettes    Start date: 12/10/1993   Smokeless tobacco: Never  Vaping Use   Vaping status: Never Used  Substance Use Topics   Alcohol use: No   Drug use: No     Allergies   Shellfish allergy and Tramadol    Review of Systems Review of Systems  Constitutional:  Negative for chills and fever.  HENT:  Positive for congestion and rhinorrhea. Negative for ear pain, sinus pressure, sinus pain and sore throat.   Eyes:  Negative for pain and visual disturbance.  Respiratory:  Positive for cough, chest tightness and wheezing. Negative for shortness of breath.   Cardiovascular:  Negative for chest pain and palpitations.  Gastrointestinal:  Negative for abdominal pain and vomiting.  Genitourinary:  Negative for dysuria and hematuria.  Musculoskeletal:  Negative for arthralgias and back pain.  Skin:  Negative for color change and rash.  Neurological:  Negative for seizures and syncope.  All other systems reviewed and are negative.    Physical Exam Triage Vital Signs ED Triage Vitals  Encounter Vitals Group     BP 09/13/23 1828 127/85     Systolic BP Percentile --      Diastolic BP Percentile --      Pulse Rate 09/13/23 1828 65     Resp 09/13/23 1828 16     Temp 09/13/23 1828 98.4 F (36.9 C)     Temp Source  09/13/23 1828 Oral     SpO2 09/13/23 1828 98 %     Weight --      Height --      Head Circumference --      Peak Flow --      Pain Score 09/13/23 1827 5     Pain Loc --      Pain Education --      Exclude from Growth Chart --    No data found.  Updated Vital Signs BP 127/85 (BP Location: Left Arm)   Pulse 65   Temp 98.4 F (36.9 C) (Oral)   Resp 16   LMP 09/03/2023   SpO2 98%   Visual Acuity Right Eye Distance:   Left Eye Distance:   Bilateral Distance:    Right Eye Near:   Left Eye Near:    Bilateral Near:     Physical Exam Constitutional:      Appearance: Normal appearance.  HENT:     Head: Normocephalic.     Right Ear: Hearing, tympanic membrane, ear canal and external ear normal.     Left Ear: Hearing, tympanic membrane, ear canal and external ear normal.     Nose: Mucosal edema, congestion and rhinorrhea present. Rhinorrhea is clear.     Right Turbinates: Swollen.     Left Turbinates: Swollen.     Right Sinus: No maxillary sinus tenderness or frontal sinus tenderness.     Left Sinus: No maxillary sinus tenderness or frontal sinus tenderness.     Mouth/Throat:     Mouth: Mucous membranes are moist.     Pharynx: Uvula midline. Posterior oropharyngeal erythema present. No oropharyngeal exudate.     Tonsils: No tonsillar exudate or tonsillar abscesses.  Eyes:     General: Lids are normal. Vision grossly intact.     Conjunctiva/sclera: Conjunctivae normal.     Pupils: Pupils are equal, round, and reactive to light.  Cardiovascular:     Rate and Rhythm: Normal rate and regular rhythm.     Heart sounds: Normal heart sounds, S1 normal and S2 normal. No murmur heard. Pulmonary:     Effort: Pulmonary effort is normal.     Breath sounds: Examination of the right-upper field reveals decreased breath sounds. Examination of the left-upper field reveals decreased breath sounds. Decreased breath sounds present. No wheezing, rhonchi or rales.  Abdominal:     General:  Abdomen is flat. Bowel sounds are normal.  Palpations: Abdomen is soft.  Musculoskeletal:     Cervical back: Normal range of motion and neck supple.  Lymphadenopathy:     Head:     Right side of head: Submandibular and posterior auricular adenopathy present.     Left side of head: Submandibular and posterior auricular adenopathy present.     Cervical: Cervical adenopathy present.     Right cervical: Superficial cervical adenopathy present.     Left cervical: Superficial cervical adenopathy present.  Skin:    General: Skin is warm and dry.     Findings: No rash.  Neurological:     Mental Status: She is alert and oriented to person, place, and time.     Gait: Gait normal.  Psychiatric:        Mood and Affect: Mood normal.        Behavior: Behavior normal.      UC Treatments / Results  Labs (all labs ordered are listed, but only abnormal results are displayed) Labs Reviewed  POCT RAPID STREP A (OFFICE)    EKG   Radiology No results found.  Procedures Procedures (including critical care time)  Medications Ordered in UC Medications  albuterol  (VENTOLIN  HFA) 108 (90 Base) MCG/ACT inhaler 2 puff (has no administration in time range)  Aerochamber Plus device 1 each (has no administration in time range)  methylPREDNISolone  acetate (DEPO-MEDROL ) injection 80 mg (has no administration in time range)    Initial Impression / Assessment and Plan / UC Course  I have reviewed the triage vital signs and the nursing notes.  Pertinent labs & imaging results that were available during my care of the patient were reviewed by me and considered in my medical decision making (see chart for details).  Sore throat: Rapid strep is negative.  Encouraged to use over-the-counter acetaminophen  or ibuprofen , as directed on the package, as needed for pain.  I believe the throat pain is secondary to coughing and drainage of secretions.  Encouraged plenty of fluids especially hot fluids or icy  fluids.  Acute bronchitis: Educated about bronchitis with handout given.  Given Depo-Medrol  80 mg IM now.  Provided an albuterol  inhaler and spacer.  Use the inhaler, 2 puffs, every 4 hours as needed for wheezing or cough.  Provided promethazine  DM, 5 mL, every 6-8 hours, as needed for cough.  Given a work excuse.  Follow-up here if symptoms do not improve, worsen or if new symptoms occur. Final Clinical Impressions(s) / UC Diagnoses   Final diagnoses:  Sore throat  Acute cough  Acute bronchitis, unspecified organism     Discharge Instructions      Rapid strep is negative.  Final diagnosis is viral bronchitis.  Antibiotics are not needed.  Given Depo-Medrol  80 mg intramuscular during the visit.  Provided an albuterol  inhaler and spacer to use for improved respiratory function.  Take the albuterol  inhaler, 2 puffs, every 4 hours as needed for cough or wheezing.  Added Promethazine  DM, 5 mL, every 6-8 hours as needed for cough.  Increase fluid intake.  Follow-up if symptoms do not improve, will worsen or if new symptoms occur.     ED Prescriptions     Medication Sig Dispense Auth. Provider   promethazine -dextromethorphan (PROMETHAZINE -DM) 6.25-15 MG/5ML syrup Take 5 mLs by mouth 4 (four) times daily as needed for cough. 118 mL Ival Domino, FNP      PDMP not reviewed this encounter.   Ival Domino, FNP 09/13/23 [phone]

## 2023-09-13 NOTE — Discharge Instructions (Addendum)
 Rapid strep is negative.  Final diagnosis is viral bronchitis.  Antibiotics are not needed.  Given Depo-Medrol  80 mg intramuscular during the visit.  Provided an albuterol  inhaler and spacer to use for improved respiratory function.  Take the albuterol  inhaler, 2 puffs, every 4 hours as needed for cough or wheezing.  Added Promethazine  DM, 5 mL, every 6-8 hours as needed for cough.  Increase fluid intake.  Follow-up if symptoms do not improve, will worsen or if new symptoms occur.

## 2023-09-13 NOTE — ED Triage Notes (Signed)
Pt had cough that been ongoing for 2 week.s sometimes dry and sometimes productive. Worse at night. Tried taking Mucinex and Robitussin as well as cough drops without relief. Repots throat is sore from coughing.

## 2024-08-23 ENCOUNTER — Encounter (HOSPITAL_COMMUNITY): Payer: Self-pay

## 2024-08-23 ENCOUNTER — Ambulatory Visit (HOSPITAL_COMMUNITY)
Admission: EM | Admit: 2024-08-23 | Discharge: 2024-08-23 | Disposition: A | Discharge status: Home / Self Care | Attending: Nurse Practitioner | Admitting: Nurse Practitioner

## 2024-08-23 DIAGNOSIS — N3001 Acute cystitis with hematuria: Secondary | ICD-10-CM | POA: Diagnosis not present

## 2024-08-23 DIAGNOSIS — N898 Other specified noninflammatory disorders of vagina: Secondary | ICD-10-CM | POA: Diagnosis not present

## 2024-08-23 LAB — POCT URINALYSIS DIP (MANUAL ENTRY)
Bilirubin, UA: NEGATIVE
Glucose, UA: NEGATIVE mg/dL
Ketones, POC UA: NEGATIVE mg/dL
Nitrite, UA: NEGATIVE
Protein Ur, POC: NEGATIVE mg/dL
Spec Grav, UA: <=1.005 — AB (ref 1.010–1.025)
Urobilinogen, UA: 0.2 U/dL
pH, UA: 6 (ref 5.0–8.0)

## 2024-08-23 MED ORDER — NITROFURANTOIN MONOHYD MACRO 100 MG PO CAPS: 100.0000 mg | ORAL_CAPSULE | Freq: Two times a day (BID) | ORAL | 0 refills | Status: AC

## 2024-08-23 NOTE — Discharge Instructions (Signed)
 Take the Macrobid as prescribed to treat UTI.  Will contact you if the urine culture shows we need to change the antibiotic.  We will also contact you if the vaginal swab comes back positive for anything.  Recommend hydration with plenty of water.  Seek care if symptoms worsen and you develop fever, nausea/vomiting, or severe pain despite treatment.

## 2024-08-23 NOTE — ED Triage Notes (Signed)
 Patient having burning with urination, vaginal odor, and vaginal discharge onset 4 days ago. States it feels like her bladder is never empty. Patient denies any new sexual partners or concerns for stis.   Patient tried AZO otc with no relief.

## 2024-08-23 NOTE — ED Provider Notes (Signed)
 [facility]    CSN: [id number] Arrival date & time: 08/23/24  1109      History   Chief Complaint Chief Complaint  Patient presents with   Dysuria   Vaginal Discharge    HPI Shari Murray is a 39 y.o. female.   Patient presents with 4-day history of dysuria, urinary frequency and urgency, voiding small amounts, foul urinary and vaginal odor.  She reports she feels like she cannot empty her bladder.  No new urinary incontinence, abdominal pain, fever, flank pain, nausea, or vomiting.  She also endorses vaginal discharge that is normal in color but is increased volume, odorous, and itchy at times.  She is in a monogamous relationship, no concern for STIs today.  Reports frequent history of UTIs after sexual intercourse but does not usually seek care for them.  Reports she has not taken antibiotics for more than a year and prior to that, was treated with Macrobid for UTIs with good success.  Has not taken anything besides Azo for symptoms and the Azo did not really help.     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 Authorizing Provider  nitrofurantoin, macrocrystal-monohydrate, (MACROBID) 100 MG capsule Take 1 capsule (100 mg total) by mouth 2 (two) times  daily for 5 days. 08/23/24 08/28/24 Yes Chandra Raisin A, NP  Iron-FA-B Cmp-C-Biot-Probiotic (FUSION PLUS) CAPS Take 1 capsule by mouth daily. 04/15/21   Franchot Lauraine HERO, NP    Family History Family History  Problem Relation Age of Onset   Hypertension Father     Social History Social History[1]   Allergies   Shellfish allergy and Tramadol    Review of Systems Review of Systems Per HPI  Physical Exam Triage Vital Signs ED Triage Vitals  Encounter Vitals Group     BP 08/23/24 1143 113/73     Girls Systolic BP Percentile --      Girls Diastolic BP Percentile --      Boys Systolic BP Percentile --      Boys Diastolic BP Percentile --      Pulse Rate 08/23/24 1143 75     Resp 08/23/24 1143 17     Temp 08/23/24 1143 97.9 F (36.6 C)     Temp Source 08/23/24 1143 Oral     SpO2 08/23/24 1143 98 %     Weight 08/23/24 1143 130 lb (59 kg)     Height 08/23/24 1143 4' 9 (1.448 m)  Head Circumference --      Peak Flow --      Pain Score 08/23/24 1140 7     Pain Loc --      Pain Education --      Exclude from Growth Chart --    No data found.  Updated Vital Signs BP 113/73 (BP Location: Right Arm)   Pulse 75   Temp 97.9 F (36.6 C) (Oral)   Resp 17   Ht 4' 9 (1.448 m)   Wt 130 lb (59 kg)   LMP 08/12/2024 (Approximate)   SpO2 98%   Breastfeeding No   BMI 28.13 kg/m   Visual Acuity Right Eye Distance:   Left Eye Distance:   Bilateral Distance:    Right Eye Near:   Left Eye Near:    Bilateral Near:     Physical Exam Vitals and nursing note reviewed.  Constitutional:      General: She is not in acute distress.    Appearance: She is not toxic-appearing.  Abdominal:     General: Abdomen is flat. Bowel sounds are normal. There is no distension.     Palpations: Abdomen is soft. There is no mass.     Tenderness: There is no abdominal tenderness. There is no right CVA tenderness, left CVA tenderness or guarding.  Genitourinary:    Comments: Deferred -  self swab performed by patient Skin:    General: Skin is warm and dry.     Coloration: Skin is not jaundiced or pale.     Findings: No erythema.  Neurological:     Mental Status: She is alert and oriented to person, place, and time.     Motor: No weakness.     Gait: Gait normal.  Psychiatric:        Behavior: Behavior is cooperative.      UC Treatments / Results  Labs (all labs ordered are listed, but only abnormal results are displayed) Labs Reviewed  POCT URINALYSIS DIP (MANUAL ENTRY) - Abnormal; Notable for the following components:      Result Value   Clarity, UA turbid (*)    Spec Grav, UA <=1.005 (*)    Blood, UA trace-lysed (*)    Leukocytes, UA Moderate (2+) (*)    All other components within normal limits  URINE CULTURE  CERVICOVAGINAL ANCILLARY ONLY    EKG   Radiology No results found.  Procedures Procedures (including critical care time)  Medications Ordered in UC Medications - No data to display  Initial Impression / Assessment and Plan / UC Course  I have reviewed the triage vital signs and the nursing notes.  Pertinent labs & imaging results that were available during my care of the patient were reviewed by me and considered in my medical decision making (see chart for details).   Patient is a very pleasant, well-appearing 38 year old female presenting today for UTI symptoms.  Vital signs are stable and examination is reassuring today; no CVA tenderness.  Urinalysis is turbid with trace lysed blood, moderate leukocyte esterase.  Urine culture is pending.  In meantime, will treat for UTI with Macrobid.  Vaginal cytology is pending additionally-treat as indicated.  Patient declines HIV and syphilis testing today.  Return and ER precautions discussed.  The patient was given the opportunity to ask questions.  All questions answered to their satisfaction.  The patient is in agreement to this plan.   Final Clinical Impressions(s) / UC Diagnoses   Final  diagnoses:  Acute cystitis with  hematuria  Vaginal discharge     Discharge Instructions      Take the Macrobid as prescribed to treat UTI.  Will contact you if the urine culture shows we need to change the antibiotic.  We will also contact you if the vaginal swab comes back positive for anything.  Recommend hydration with plenty of water.  Seek care if symptoms worsen and you develop fever, nausea/vomiting, or severe pain despite treatment.     ED Prescriptions     Medication Sig Dispense Auth. Provider   nitrofurantoin, macrocrystal-monohydrate, (MACROBID) 100 MG capsule Take 1 capsule (100 mg total) by mouth 2 (two) times daily for 5 days. 10 capsule Chandra Harlene LABOR, NP      PDMP not reviewed this encounter.    [1]  Social History Tobacco Use   Smoking status: Every Day    Current packs/day: 0.75    Average packs/day: 0.8 packs/day for 30.7 years (23.0 ttl pk-yrs)    Types: Cigarettes    Start date: 12/10/1993   Smokeless tobacco: Never  Vaping Use   Vaping status: Never Used  Substance Use Topics   Alcohol use: No   Drug use: No     Chandra Harlene LABOR, NP 08/23/24 1328

## 2024-08-24 LAB — CERVICOVAGINAL ANCILLARY ONLY
Bacterial Vaginitis (gardnerella): NEGATIVE
Chlamydia: NEGATIVE
Comment: NEGATIVE
Comment: NEGATIVE
Comment: NEGATIVE
Comment: NORMAL
Neisseria Gonorrhea: NEGATIVE
Trichomonas: NEGATIVE

## 2024-08-25 LAB — URINE CULTURE: Culture: 40000 — AB

## 2024-08-27 ENCOUNTER — Ambulatory Visit (HOSPITAL_COMMUNITY): Payer: Self-pay

## 2024-09-12 ENCOUNTER — Ambulatory Visit (HOSPITAL_COMMUNITY)
Admission: EM | Admit: 2024-09-12 | Discharge: 2024-09-12 | Disposition: A | Discharge status: Home / Self Care | Source: Home / Self Care

## 2024-09-12 ENCOUNTER — Observation Stay (HOSPITAL_BASED_OUTPATIENT_CLINIC_OR_DEPARTMENT_OTHER)
Admission: EM | Admit: 2024-09-12 | Discharge: 2024-09-15 | Disposition: A | Discharge status: Home / Self Care | Source: Ambulatory Visit | Attending: Emergency Medicine | Admitting: Emergency Medicine

## 2024-09-12 ENCOUNTER — Encounter (HOSPITAL_COMMUNITY): Payer: Self-pay | Admitting: *Deleted

## 2024-09-12 ENCOUNTER — Emergency Department (HOSPITAL_BASED_OUTPATIENT_CLINIC_OR_DEPARTMENT_OTHER)

## 2024-09-12 ENCOUNTER — Other Ambulatory Visit: Payer: Self-pay

## 2024-09-12 ENCOUNTER — Encounter (HOSPITAL_BASED_OUTPATIENT_CLINIC_OR_DEPARTMENT_OTHER): Payer: Self-pay

## 2024-09-12 DIAGNOSIS — F17210 Nicotine dependence, cigarettes, uncomplicated: Secondary | ICD-10-CM | POA: Insufficient documentation

## 2024-09-12 DIAGNOSIS — R1011 Right upper quadrant pain: Secondary | ICD-10-CM

## 2024-09-12 DIAGNOSIS — K810 Acute cholecystitis: Principal | ICD-10-CM

## 2024-09-12 DIAGNOSIS — K8012 Calculus of gallbladder with acute and chronic cholecystitis without obstruction: Principal | ICD-10-CM | POA: Insufficient documentation

## 2024-09-12 DIAGNOSIS — K82A1 Gangrene of gallbladder in cholecystitis: Secondary | ICD-10-CM | POA: Insufficient documentation

## 2024-09-12 DIAGNOSIS — Z9049 Acquired absence of other specified parts of digestive tract: Secondary | ICD-10-CM

## 2024-09-12 LAB — URINALYSIS, ROUTINE W REFLEX MICROSCOPIC
Bilirubin Urine: NEGATIVE
Glucose, UA: NEGATIVE mg/dL
Ketones, ur: NEGATIVE mg/dL
Leukocytes,Ua: NEGATIVE
Nitrite: NEGATIVE
Protein, ur: NEGATIVE mg/dL
Specific Gravity, Urine: 1.01 (ref 1.005–1.030)
pH: 6 (ref 5.0–8.0)

## 2024-09-12 LAB — DIFFERENTIAL
Abs Immature Granulocytes: 0.21 K/uL — ABNORMAL HIGH (ref 0.00–0.07)
Basophils Absolute: 0.1 K/uL (ref 0.0–0.1)
Basophils Relative: 1 %
Eosinophils Absolute: 0.2 K/uL (ref 0.0–0.5)
Eosinophils Relative: 1 %
Immature Granulocytes: 1 %
Lymphocytes Relative: 10 %
Lymphs Abs: 2.7 K/uL (ref 0.7–4.0)
Monocytes Absolute: 1.8 K/uL — ABNORMAL HIGH (ref 0.1–1.0)
Monocytes Relative: 6 %
Neutro Abs: 23 K/uL — ABNORMAL HIGH (ref 1.7–7.7)
Neutrophils Relative %: 81 %

## 2024-09-12 LAB — COMPREHENSIVE METABOLIC PANEL WITH GFR
ALT: 24 U/L (ref 0–44)
AST: 29 U/L (ref 15–41)
Albumin: 4.5 g/dL (ref 3.5–5.0)
Alkaline Phosphatase: 91 U/L (ref 38–126)
Anion gap: 16 — ABNORMAL HIGH (ref 5–15)
BUN: <5 mg/dL — ABNORMAL LOW (ref 6–20)
CO2: 24 mmol/L (ref 22–32)
Calcium: 9.3 mg/dL (ref 8.9–10.3)
Chloride: 98 mmol/L (ref 98–111)
Creatinine, Ser: 0.57 mg/dL (ref 0.44–1.00)
GFR, Estimated: >60 mL/min
Glucose, Bld: 151 mg/dL — ABNORMAL HIGH (ref 70–99)
Potassium: 3.5 mmol/L (ref 3.5–5.1)
Sodium: 137 mmol/L (ref 135–145)
Total Bilirubin: 0.9 mg/dL (ref 0.0–1.2)
Total Protein: 7.9 g/dL (ref 6.5–8.1)

## 2024-09-12 LAB — CBC
HCT: 34.6 % — ABNORMAL LOW (ref 36.0–46.0)
Hemoglobin: 11.6 g/dL — ABNORMAL LOW (ref 12.0–15.0)
MCH: 19.6 pg — ABNORMAL LOW (ref 26.0–34.0)
MCHC: 33.5 g/dL (ref 30.0–36.0)
MCV: 58.4 fL — ABNORMAL LOW (ref 80.0–100.0)
Platelets: 450 K/uL — ABNORMAL HIGH (ref 150–400)
RBC: 5.92 MIL/uL — ABNORMAL HIGH (ref 3.87–5.11)
RDW: 17.4 % — ABNORMAL HIGH (ref 11.5–15.5)
WBC: 27.9 K/uL — ABNORMAL HIGH (ref 4.0–10.5)
nRBC: 0 % (ref 0.0–0.2)

## 2024-09-12 LAB — LACTIC ACID, PLASMA: Lactic Acid, Venous: 1.7 mmol/L (ref 0.5–1.9)

## 2024-09-12 LAB — PREGNANCY, URINE: Preg Test, Ur: NEGATIVE

## 2024-09-12 LAB — LIPASE, BLOOD: Lipase: 22 U/L (ref 11–51)

## 2024-09-12 MED ORDER — NICOTINE 21 MG/24HR TD PT24
21.0000 mg | MEDICATED_PATCH | Freq: Once | TRANSDERMAL | Status: AC
  Administered 2024-09-12: 21 mg via TRANSDERMAL
  Filled 2024-09-12: qty 1

## 2024-09-12 MED ORDER — MORPHINE SULFATE (PF) 4 MG/ML IV SOLN
4.0000 mg | Freq: Once | INTRAVENOUS | Status: AC
  Administered 2024-09-12: 4 mg via INTRAVENOUS
  Filled 2024-09-12: qty 1

## 2024-09-12 MED ORDER — ACETAMINOPHEN 500 MG PO TABS
1000.0000 mg | ORAL_TABLET | Freq: Once | ORAL | Status: AC
  Administered 2024-09-12: 1000 mg via ORAL
  Filled 2024-09-12: qty 2

## 2024-09-12 MED ORDER — SODIUM CHLORIDE 0.9 % IV SOLN
2.0000 g | Freq: Three times a day (TID) | INTRAVENOUS | Status: DC
  Administered 2024-09-12 – 2024-09-13 (×3): 2 g via INTRAVENOUS
  Filled 2024-09-12 (×4): qty 12.5

## 2024-09-12 MED ORDER — METRONIDAZOLE 500 MG/100ML IV SOLN
500.0000 mg | Freq: Once | INTRAVENOUS | Status: AC
  Administered 2024-09-12: 500 mg via INTRAVENOUS
  Filled 2024-09-12: qty 100

## 2024-09-12 MED ORDER — IOHEXOL 300 MG/ML  SOLN
100.0000 mL | Freq: Once | INTRAMUSCULAR | Status: AC | PRN
  Administered 2024-09-12: 100 mL via INTRAVENOUS

## 2024-09-12 MED ORDER — METRONIDAZOLE 500 MG/100ML IV SOLN
500.0000 mg | Freq: Two times a day (BID) | INTRAVENOUS | Status: DC
  Administered 2024-09-13: 500 mg via INTRAVENOUS
  Filled 2024-09-12: qty 100

## 2024-09-12 MED ORDER — SODIUM CHLORIDE 0.9 % IV BOLUS
1000.0000 mL | Freq: Once | INTRAVENOUS | Status: AC
  Administered 2024-09-12: 1000 mL via INTRAVENOUS

## 2024-09-12 NOTE — ED Notes (Signed)
 Patient is being discharged from the Urgent Care and sent to the Emergency Department via POV . Per Almarie Pizza, PA-C, patient is in need of higher level of care due to abdominal pain. Patient is aware and verbalizes understanding of plan of care.  Vitals:   09/12/24 1124  BP: 114/72  Pulse: 87  Resp: 14  Temp: 98.9 F (37.2 C)  SpO2: 99%

## 2024-09-12 NOTE — ED Provider Notes (Addendum)
 " [place] EMERGENCY DEPARTMENT AT [facility] Provider Note   CSN: [id number] Arrival date & time: 09/12/24  1253     Patient presents with: Abdominal Pain   Shari Murray is a 38 y.o. female self reportedly otherwise healthy presents to the emergency department today for evaluation of right upper quadrant pain for the past 3 days.  Reports been constant.  Associate with nausea but no vomiting.  She feels bloated.  Still passing gas.  Reports it is worse after eating.  Denies any diarrhea, constipation, hematochezia, melena, fever, chest pain, or shortness of breath.  Went to urgent care instructed to the emergency ferment to rule out any gallbladder etiology.  She she denies any medical or surgical history.  No daily medications.  She is allergic to tramadol  and shellfish.  Does smoke 1 pack/day.   Abdominal Pain Associated symptoms: nausea   Associated symptoms: no chest pain, no chills, no constipation, no diarrhea, no dysuria, no fever, no hematuria, no shortness of breath and no vomiting        Prior to Admission medications  Medication Sig Start Date End Date Taking? Authorizing Provider  Iron-FA-B Cmp-C-Biot-Probiotic (FUSION PLUS) CAPS Take 1 capsule by mouth daily. 04/15/21   Franchot Lauraine HERO, NP    Allergies: Shellfish allergy and Tramadol     Review of Systems  Constitutional:  Negative for chills and fever.  Respiratory:  Negative for shortness of breath.   Cardiovascular:  Negative for chest pain.  Gastrointestinal:  Positive for abdominal distention, abdominal pain and nausea. Negative for constipation, diarrhea and vomiting.  Genitourinary:  Negative for dysuria and hematuria.    Updated Vital Signs BP 115/75   Pulse 87   Temp 98.7 F (37.1 C) (Oral)   Resp 18   Ht 4' 9 (1.448 m)   Wt 59 kg   LMP 09/06/2024 (Approximate)   SpO2 99%   BMI 28.13 kg/m   Physical Exam Vitals and nursing note reviewed.  Constitutional:      General: She is not  in acute distress.    Appearance: She is not toxic-appearing.  Pulmonary:     Effort: Pulmonary effort is normal. No respiratory distress.  Abdominal:     General: There is distension.     Tenderness: There is abdominal tenderness in the right upper quadrant. There is no guarding or rebound.     Comments: Some abdominal distention/bloating but soft.  Does have tenderness to the right upper quadrant.  No guarding or rebound.  Skin:    General: Skin is warm and dry.  Neurological:     Mental Status: She is alert.     (all labs ordered are listed, but only abnormal results are displayed) Labs Reviewed  COMPREHENSIVE METABOLIC PANEL WITH GFR - Abnormal; Notable for the following components:      Result Value   Glucose, Bld 151 (*)    BUN <5 (*)    Anion gap 16 (*)    All other components within normal limits  CBC - Abnormal; Notable for the following components:   WBC 27.9 (*)    RBC 5.92 (*)    Hemoglobin 11.6 (*)    HCT 34.6 (*)    MCV 58.4 (*)    MCH 19.6 (*)    RDW 17.4 (*)    Platelets 450 (*)    All other components within normal limits  URINALYSIS, ROUTINE W REFLEX MICROSCOPIC - Abnormal; Notable for the following components:   Hgb urine dipstick SMALL (*)  Bacteria, UA RARE (*)    All other components within normal limits  CULTURE, BLOOD (ROUTINE X 2)  CULTURE, BLOOD (ROUTINE X 2)  LIPASE, BLOOD  PREGNANCY, URINE  LACTIC ACID, PLASMA  LACTIC ACID, PLASMA    EKG: None  Radiology: CT ABDOMEN PELVIS W CONTRAST Result Date: 09/12/2024 EXAM: CT ABDOMEN AND PELVIS WITH CONTRAST 09/12/2024 04:07:19 PM TECHNIQUE: CT of the abdomen and pelvis was performed with the administration of 100 mL of iohexol (OMNIPAQUE) 300 MG/ML solution. Multiplanar reformatted images are provided for review. Automated exposure control, iterative reconstruction, and/or weight-based adjustment of the mA/kV was utilized to reduce the radiation dose to as low as reasonably achievable.  COMPARISON: None available. CLINICAL HISTORY: Abdominal pain, acute, nonlocalized; more RUQ tenderness to palpation. FINDINGS: LOWER CHEST: No acute abnormality. LIVER: The liver is unremarkable. GALLBLADDER AND BILE DUCTS: Diffuse gallbladder wall thickening and edema. Several stones noted within the gallbladder neck with an impacted stone. There is a fused pericholecystic edema. No biliary ductal dilatation. SPLEEN: No acute abnormality. PANCREAS: No acute abnormality. ADRENAL GLANDS: No acute abnormality. KIDNEYS, URETERS AND BLADDER: No stones in the kidneys or ureters. No hydronephrosis. No perinephric or periureteral stranding. Urinary bladder is unremarkable. GI AND BOWEL: Stomach demonstrates no acute abnormality. There is no bowel obstruction. PERITONEUM AND RETROPERITONEUM: Trace free fluid in the pelvis. No free air. VASCULATURE: Aorta is normal in caliber. LYMPH NODES: No lymphadenopathy. REPRODUCTIVE ORGANS: No acute abnormality. BONES AND SOFT TISSUES: No acute osseous abnormality. No focal soft tissue abnormality. IMPRESSION: 1. Cholelithiasis with findings most consistent with Acute cholecystitis. Clinical correlation and further evaluation with ultrasound recommended. Electronically signed by: Vanetta Chou MD 09/12/2024 05:08 PM EST RP Workstation: [id number]    Procedures   Medications Ordered in the ED  morphine (PF) 4 MG/ML injection 4 mg (has no administration in time range)  sodium chloride  0.9 % bolus 1,000 mL (has no administration in time range)  metroNIDAZOLE (FLAGYL) IVPB 500 mg (has no administration in time range)  ceFEPIme (MAXIPIME) 2 g in sodium chloride  0.9 % 100 mL IVPB (has no administration in time range)   Clinical Course as of 09/12/24 2118  Wed Sep 12, 2024  1718 Spoke with General Surgery. Patient will be going to [facility] or [facility] for surgery likely tomorrow. Transfer will be dependent on bed availability. They will be admitting her to their service.   [RR]     Clinical Course User Index [RR] Bernis Ernst, PA-C                                 Medical Decision Making Amount and/or Complexity of Data Reviewed Labs: ordered. Radiology: ordered.  Risk OTC drugs. Prescription drug management. Decision regarding hospitalization.   38 y.o. female presents to the ER for evaluation of right upper quadrant pain. Differential diagnosis includes but is not limited to AAA, mesenteric ischemia, appendicitis, diverticulitis, DKA, gastroenteritis, nephrolithiasis, pancreatitis, constipation, UTI, bowel obstruction, biliary disease, IBD, PUD, hepatitis. Vital signs unremarkable. Physical exam as noted above.   Workup initiated in triage.  I independently reviewed and interpreted the patient's labs.  Urinalysis shows small amount of hemoglobin with rare bacteria but no red blood cells or white blood cells.  Pregnancy test is negative.  CMP shows glucose of 151, BUN less than 5.  Anion gap of 16.  No other electrolyte or LFT abnormality.  Lipase at 22.  CBC shows significant leukocytosis at 27.9.  Platelets at 450.  Hemoglobin 11.6.  Unfortunately, we do not have ultrasound available in the emergency department today.  Will order CT scan.  Patient initially had some tachycardia but given significant elevation in white blood cell count, I did order lactic and blood cultures.  Will start patient on empiric intra-abdominal antibiotics with cefepime and Flagyl.  CT shows Cholelithiasis with findings most consistent with Acute cholecystitis. Clinical correlation and further evaluation with ultrasound recommended. Per radiologist's interpretation.    I started the patient on cefepime, Flagyl, and gave her fluid bolus.  Not hypotensive.  Patient does not have a history of hypertension.  Do not think aggressive fluid hydration is needed for sepsis protocol given normal lactic as well as normotensive and normal heart rate.  I consulted general surgery ans poke with Dr.  Lyndel. He agrees with the antibiotics ordered.  The plan is to keep at [facility] until surgery tomorrow morning at [facility] with Dr. Ebbie and Dr. Rubin.  General surgery will be the admitting providers.  He has called [facility] to let the patient know they will be transferred to [facility] to preop in the morning.  I discussed the results with the patient and the other at bedside.  Agree with the plan.  I have prescribed the patient a nicotine  patch as well.  Patient now has fever of 101.3.  Will prescribe Tylenol .  General surgeon alerted. Plan is unchanged.   Portions of this report may have been transcribed using voice recognition software. Every effort was made to ensure accuracy; however, inadvertent computerized transcription errors may be present.    Final diagnoses:  Acute cholecystitis    ED Discharge Orders     None          Bernis Ernst, PA-C 09/12/24 1853    Bernis Ernst, PA-C 09/12/24 2119    Lenor Hollering, MD 09/12/24 2126  "

## 2024-09-12 NOTE — ED Notes (Signed)
-  Patient going to [facility] at 730am. Accepting Dr. Rubin. Report # [phone].

## 2024-09-12 NOTE — ED Provider Notes (Signed)
 " [facility]    CSN: [id number] Arrival date & time: 09/12/24  0957      History   Chief Complaint Chief Complaint  Patient presents with   Abdominal Pain    HPI Shari Murray is a 38 y.o. female.   38 year old female presents urgent care with complaints of right upper quadrant pain and bloating.  This started about 3 days ago but has continued to worsen.  She was unable to sleep last night due to the pain.  The pain gets much worse after she eats as does the bloating.  She has not really had any nausea and denies any vomiting.  She does feel like when she is having the symptoms that she gets very hot but has not had a temperature that she knows of.  She denies any dysuria, hematuria or diarrhea, cough, congestion.  She has never had symptoms like this in the past.   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 Authorizing Provider  Iron-FA-B Cmp-C-Biot-Probiotic (FUSION PLUS) CAPS Take 1 capsule by mouth daily. 04/15/21   Franchot Lauraine HERO, NP     Family History Family History  Problem Relation Age of Onset   Hypertension Father     Social History Social History[1]   Allergies   Shellfish allergy and Tramadol    Review of Systems Review of Systems  Constitutional:  Negative for chills and fever.  HENT:  Negative for ear pain and sore throat.   Eyes:  Negative for pain and visual disturbance.  Respiratory:  Negative for cough and shortness of breath.   Cardiovascular:  Negative for chest pain and palpitations.  Gastrointestinal:  Positive for abdominal distention and abdominal pain. Negative for constipation, diarrhea, nausea and vomiting.  Genitourinary:  Negative for dysuria and hematuria.  Musculoskeletal:  Negative for arthralgias and back pain.  Skin:  Negative for color change and rash.  Neurological:  Negative for seizures and syncope.  All other systems reviewed and are negative.    Physical Exam Triage Vital Signs ED Triage Vitals  Encounter Vitals Group     BP 09/12/24 1124 114/72     Girls Systolic BP Percentile --      Girls Diastolic BP Percentile --  Boys Systolic BP Percentile --      Boys Diastolic BP Percentile --      Pulse Rate 09/12/24 1124 87     Resp 09/12/24 1124 14     Temp 09/12/24 1124 98.9 F (37.2 C)     Temp Source 09/12/24 1124 Oral     SpO2 09/12/24 1124 99 %     Weight --      Height --      Head Circumference --      Peak Flow --      Pain Score 09/12/24 1122 6     Pain Loc --      Pain Education --      Exclude from Growth Chart --    No data found.  Updated Vital Signs BP 114/72 (BP Location: Right Arm)   Pulse 87   Temp 98.9 F (37.2 C) (Oral)   Resp 14   LMP 09/06/2024 (Approximate)   SpO2 99%   Visual Acuity Right Eye Distance:   Left Eye Distance:   Bilateral Distance:    Right Eye Near:   Left Eye Near:    Bilateral Near:     Physical Exam Vitals and nursing note reviewed.  Constitutional:      General: She is not in acute distress.     Appearance: She is well-developed.  HENT:     Head: Normocephalic and atraumatic.  Eyes:     Conjunctiva/sclera: Conjunctivae normal.  Cardiovascular:     Rate and Rhythm: Normal rate and regular rhythm.     Heart sounds: No murmur heard. Pulmonary:     Effort: Pulmonary effort is normal. No respiratory distress.     Breath sounds: Normal breath sounds.  Abdominal:     General: Bowel sounds are normal. There is no distension.     Palpations: Abdomen is soft.     Tenderness: There is abdominal tenderness in the right upper quadrant. There is guarding (Some guarding). There is no right CVA tenderness, left CVA tenderness or rebound.  Musculoskeletal:        General: No swelling.     Cervical back: Neck supple.  Skin:    General: Skin is warm and dry.     Capillary Refill: Capillary refill takes less than 2 seconds.  Neurological:     Mental Status: She is alert.  Psychiatric:        Mood and Affect: Mood normal.      UC Treatments / Results  Labs (all labs ordered are listed, but only abnormal results are displayed) Labs Reviewed - No data to display  EKG   Radiology No results found.  Procedures Procedures (including critical care time)  Medications Ordered in UC Medications - No data to display  Initial Impression / Assessment and Plan / UC Course  I have reviewed the triage vital signs and the nursing notes.  Pertinent labs & imaging results that were available during my care of the patient were reviewed by me and considered in my medical decision making (see chart for details).     RUQ abdominal pain   Due to the severity of the right upper quadrant abdominal pain on physical exam along with the symptoms and the contributing factors to the symptoms, we feel this could be acute cholecystitis. We feel that further evaluation is indicated at the emergency department where more advanced workup can be obtained.  Final Clinical Impressions(s) / UC Diagnoses    Final diagnoses:  RUQ abdominal pain  Discharge Instructions      Due to the severity of the right upper quadrant abdominal pain on physical exam along with the symptoms and the contributing factors to the symptoms, we feel this could be acute cholecystitis. We feel that further evaluation is indicated at the emergency department where more advanced workup can be obtained.    ED Prescriptions   None    PDMP not reviewed this encounter.    [1]  Social History Tobacco Use   Smoking status: Every Day    Current packs/day: 0.75    Average packs/day: 0.8 packs/day for 30.8 years (23.1 ttl pk-yrs)    Types: Cigarettes    Start date: 12/10/1993   Smokeless tobacco: Never  Vaping Use   Vaping status: Never Used  Substance Use Topics   Alcohol use: No   Drug use: No     Teresa Almarie LABOR, PA-C 09/12/24 1229  "

## 2024-09-12 NOTE — ED Triage Notes (Signed)
 Pt reports RUQ abd pain x3 days. Pt reports nausea. Pt went to UC and sent to ED for possible gallbladder issues.

## 2024-09-12 NOTE — ED Triage Notes (Signed)
 Pt complains of RUQ pain like a bloating feeling X 3 days. She states she has taken pepcid and other OTC meds for bloating without relief. Denies any urinary sx

## 2024-09-12 NOTE — Discharge Instructions (Addendum)
 Due to the severity of the right upper quadrant abdominal pain on physical exam along with the symptoms and the contributing factors to the symptoms, we feel this could be acute cholecystitis. We feel that further evaluation is indicated at the emergency department where more advanced workup can be obtained.

## 2024-09-13 ENCOUNTER — Encounter (HOSPITAL_COMMUNITY)
Admission: EM | Disposition: A | Discharge status: Home / Self Care | Payer: Self-pay | Source: Ambulatory Visit | Attending: Emergency Medicine

## 2024-09-13 ENCOUNTER — Emergency Department (HOSPITAL_COMMUNITY): Admitting: Anesthesiology

## 2024-09-13 DIAGNOSIS — K810 Acute cholecystitis: Secondary | ICD-10-CM | POA: Diagnosis present

## 2024-09-13 DIAGNOSIS — Z9049 Acquired absence of other specified parts of digestive tract: Secondary | ICD-10-CM | POA: Diagnosis present

## 2024-09-13 DIAGNOSIS — I959 Hypotension, unspecified: Secondary | ICD-10-CM | POA: Diagnosis not present

## 2024-09-13 DIAGNOSIS — R1084 Generalized abdominal pain: Secondary | ICD-10-CM | POA: Diagnosis not present

## 2024-09-13 DIAGNOSIS — K82A1 Gangrene of gallbladder in cholecystitis: Secondary | ICD-10-CM | POA: Diagnosis not present

## 2024-09-13 DIAGNOSIS — Z743 Need for continuous supervision: Secondary | ICD-10-CM | POA: Diagnosis not present

## 2024-09-13 HISTORY — PX: CHOLECYSTECTOMY: SHX55

## 2024-09-13 SURGERY — LAPAROSCOPIC CHOLECYSTECTOMY
Anesthesia: General | Site: Abdomen

## 2024-09-13 MED ORDER — FENTANYL CITRATE (PF) 100 MCG/2ML IJ SOLN
INTRAMUSCULAR | Status: AC
  Filled 2024-09-13: qty 2

## 2024-09-13 MED ORDER — BUPIVACAINE-EPINEPHRINE (PF) 0.25% -1:200000 IJ SOLN
INTRAMUSCULAR | Status: AC
  Filled 2024-09-13: qty 30

## 2024-09-13 MED ORDER — LIDOCAINE HCL (CARDIAC) PF 100 MG/5ML IV SOSY
PREFILLED_SYRINGE | INTRAVENOUS | Status: DC | PRN
  Administered 2024-09-13: 60 mg via INTRAVENOUS

## 2024-09-13 MED ORDER — ACETAMINOPHEN 500 MG PO TABS
1000.0000 mg | ORAL_TABLET | Freq: Four times a day (QID) | ORAL | Status: DC
  Administered 2024-09-13 – 2024-09-15 (×8): 1000 mg via ORAL
  Filled 2024-09-13 (×8): qty 2

## 2024-09-13 MED ORDER — METHOCARBAMOL 500 MG PO TABS: 500.0000 mg | ORAL_TABLET | Freq: Three times a day (TID) | ORAL | Status: DC | PRN

## 2024-09-13 MED ORDER — LACTATED RINGERS IV SOLN: INTRAVENOUS | Status: DC

## 2024-09-13 MED ORDER — MIDAZOLAM HCL 2 MG/2ML IJ SOLN
INTRAMUSCULAR | Status: AC
  Filled 2024-09-13: qty 2

## 2024-09-13 MED ORDER — ONDANSETRON HCL 4 MG/2ML IJ SOLN
INTRAMUSCULAR | Status: AC
  Filled 2024-09-13: qty 2

## 2024-09-13 MED ORDER — SPY AGENT GREEN - (INDOCYANINE FOR INJECTION)
1.2500 mg | Freq: Once | INTRAMUSCULAR | Status: AC
  Administered 2024-09-13: 1.25 mg via INTRAVENOUS

## 2024-09-13 MED ORDER — 0.9 % SODIUM CHLORIDE (POUR BTL) OPTIME
TOPICAL | Status: DC | PRN
  Administered 2024-09-13: 1000 mL

## 2024-09-13 MED ORDER — ROCURONIUM BROMIDE 100 MG/10ML IV SOLN
INTRAVENOUS | Status: DC | PRN
  Administered 2024-09-13: 40 mg via INTRAVENOUS
  Administered 2024-09-13: 10 mg via INTRAVENOUS

## 2024-09-13 MED ORDER — OXYCODONE HCL 5 MG PO TABS: 5.0000 mg | ORAL_TABLET | Freq: Once | ORAL | Status: DC | PRN

## 2024-09-13 MED ORDER — FENTANYL CITRATE (PF) 100 MCG/2ML IJ SOLN
INTRAMUSCULAR | Status: DC | PRN
  Administered 2024-09-13 (×2): 50 ug via INTRAVENOUS
  Administered 2024-09-13: 100 ug via INTRAVENOUS

## 2024-09-13 MED ORDER — OXYCODONE HCL 5 MG PO TABS
5.0000 mg | ORAL_TABLET | ORAL | Status: DC | PRN
  Administered 2024-09-13: 10 mg via ORAL
  Filled 2024-09-13: qty 2

## 2024-09-13 MED ORDER — DEXAMETHASONE SODIUM PHOSPHATE 4 MG/ML IJ SOLN
INTRAMUSCULAR | Status: DC | PRN
  Administered 2024-09-13 (×2): 8 mg via INTRAVENOUS

## 2024-09-13 MED ORDER — MIDAZOLAM HCL 5 MG/5ML IJ SOLN
INTRAMUSCULAR | Status: DC | PRN
  Administered 2024-09-13: 2 mg via INTRAVENOUS

## 2024-09-13 MED ORDER — ONDANSETRON 4 MG PO TBDP: 4.0000 mg | ORAL_TABLET | Freq: Four times a day (QID) | ORAL | Status: DC | PRN

## 2024-09-13 MED ORDER — AMISULPRIDE (ANTIEMETIC) 5 MG/2ML IV SOLN: 10.0000 mg | Freq: Once | INTRAVENOUS | Status: DC | PRN

## 2024-09-13 MED ORDER — ONDANSETRON HCL 4 MG/2ML IJ SOLN
INTRAMUSCULAR | Status: DC | PRN
  Administered 2024-09-13: 4 mg via INTRAVENOUS

## 2024-09-13 MED ORDER — FENTANYL CITRATE (PF) 50 MCG/ML IJ SOSY: 25.0000 ug | PREFILLED_SYRINGE | INTRAMUSCULAR | Status: DC | PRN

## 2024-09-13 MED ORDER — ACETAMINOPHEN 500 MG PO TABS
1000.0000 mg | ORAL_TABLET | Freq: Once | ORAL | Status: AC
  Administered 2024-09-13: 1000 mg via ORAL
  Filled 2024-09-13: qty 2

## 2024-09-13 MED ORDER — LIDOCAINE HCL (PF) 2 % IJ SOLN
INTRAMUSCULAR | Status: AC
  Filled 2024-09-13: qty 5

## 2024-09-13 MED ORDER — CHLORHEXIDINE GLUCONATE 0.12 % MT SOLN
15.0000 mL | Freq: Once | OROMUCOSAL | Status: AC
  Administered 2024-09-13: 15 mL via OROMUCOSAL

## 2024-09-13 MED ORDER — MORPHINE SULFATE (PF) 4 MG/ML IV SOLN
4.0000 mg | Freq: Once | INTRAVENOUS | Status: AC
  Administered 2024-09-13: 4 mg via INTRAVENOUS
  Filled 2024-09-13: qty 1

## 2024-09-13 MED ORDER — SODIUM CHLORIDE 0.9 % IV SOLN: INTRAVENOUS | Status: AC

## 2024-09-13 MED ORDER — OXYCODONE HCL 5 MG/5ML PO SOLN: 5.0000 mg | Freq: Once | ORAL | Status: DC | PRN

## 2024-09-13 MED ORDER — ONDANSETRON HCL 4 MG/2ML IJ SOLN: 4.0000 mg | Freq: Four times a day (QID) | INTRAMUSCULAR | Status: DC | PRN

## 2024-09-13 MED ORDER — SUGAMMADEX SODIUM 200 MG/2ML IV SOLN
INTRAVENOUS | Status: DC | PRN
  Administered 2024-09-13: 150 mg via INTRAVENOUS

## 2024-09-13 MED ORDER — HEMOSTATIC AGENTS (NO CHARGE) OPTIME
TOPICAL | Status: DC | PRN
  Administered 2024-09-13: 2 via TOPICAL

## 2024-09-13 MED ORDER — SODIUM CHLORIDE 0.9 % IV SOLN: 12.5000 mg | INTRAVENOUS | Status: DC | PRN

## 2024-09-13 MED ORDER — SODIUM CHLORIDE 0.9 % IV SOLN
2.0000 g | Freq: Three times a day (TID) | INTRAVENOUS | Status: AC
  Administered 2024-09-13 – 2024-09-14 (×3): 2 g via INTRAVENOUS
  Filled 2024-09-13 (×3): qty 12.5

## 2024-09-13 MED ORDER — SIMETHICONE 80 MG PO CHEW: 40.0000 mg | CHEWABLE_TABLET | Freq: Four times a day (QID) | ORAL | Status: DC | PRN

## 2024-09-13 MED ORDER — METRONIDAZOLE 500 MG/100ML IV SOLN
500.0000 mg | Freq: Two times a day (BID) | INTRAVENOUS | Status: AC
  Administered 2024-09-13 – 2024-09-14 (×2): 500 mg via INTRAVENOUS
  Filled 2024-09-13 (×2): qty 100

## 2024-09-13 MED ORDER — MORPHINE SULFATE (PF) 2 MG/ML IV SOLN: 2.0000 mg | INTRAVENOUS | Status: DC | PRN

## 2024-09-13 MED ORDER — METHOCARBAMOL 1000 MG/10ML IJ SOLN
500.0000 mg | Freq: Three times a day (TID) | INTRAMUSCULAR | Status: DC | PRN
  Administered 2024-09-13: 500 mg via INTRAVENOUS
  Filled 2024-09-13: qty 10

## 2024-09-13 MED ORDER — ENOXAPARIN SODIUM 40 MG/0.4ML IJ SOSY
40.0000 mg | PREFILLED_SYRINGE | INTRAMUSCULAR | Status: DC
  Administered 2024-09-14: 40 mg via SUBCUTANEOUS
  Filled 2024-09-13: qty 0.4

## 2024-09-13 MED ORDER — PROPOFOL 10 MG/ML IV BOLUS
INTRAVENOUS | Status: DC | PRN
  Administered 2024-09-13: 50 ug/kg/min via INTRAVENOUS
  Administered 2024-09-13: 150 mg via INTRAVENOUS

## 2024-09-13 MED ORDER — PROPOFOL 500 MG/50ML IV EMUL
INTRAVENOUS | Status: AC
  Filled 2024-09-13: qty 50

## 2024-09-13 MED ORDER — BUPIVACAINE-EPINEPHRINE (PF) 0.25% -1:200000 IJ SOLN
INTRAMUSCULAR | Status: DC | PRN
  Administered 2024-09-13: 10 mL

## 2024-09-13 MED ORDER — PROPOFOL 10 MG/ML IV BOLUS
INTRAVENOUS | Status: AC
  Filled 2024-09-13: qty 20

## 2024-09-13 SURGICAL SUPPLY — 34 items
BAG COUNTER SPONGE SURGICOUNT (BAG) IMPLANT
CHLORAPREP W/TINT 26 (MISCELLANEOUS) ×1 IMPLANT
CLIP APPLIE 5 13 M/L LIGAMAX5 (MISCELLANEOUS) IMPLANT
CLIP LIGATING HEMO O LOK GREEN (MISCELLANEOUS) ×1 IMPLANT
COVER MAYO STAND XLG (MISCELLANEOUS) IMPLANT
COVER TRANSDUCER ULTRASND (DRAPES) ×1 IMPLANT
DERMABOND ADVANCED .7 DNX12 (GAUZE/BANDAGES/DRESSINGS) ×1 IMPLANT
DRAIN CHANNEL 19F RND (DRAIN) IMPLANT
DRAPE C-ARM 42X120 X-RAY (DRAPES) IMPLANT
ELECT REM PT RETURN 15FT ADLT (MISCELLANEOUS) ×1 IMPLANT
EVACUATOR SILICONE 100CC (DRAIN) IMPLANT
GAUZE SPONGE 2X2 8PLY STRL LF (GAUZE/BANDAGES/DRESSINGS) ×1 IMPLANT
GLOVE BIO SURGEON STRL SZ7.5 (GLOVE) ×1 IMPLANT
GOWN STRL REUS W/ TWL XL LVL3 (GOWN DISPOSABLE) ×1 IMPLANT
GRASPER SUT TROCAR 14GX15 (MISCELLANEOUS) IMPLANT
HEMOSTAT SNOW SURGICEL 2X4 (HEMOSTASIS) IMPLANT
IRRIGATION SUCT STRKRFLW 2 WTP (MISCELLANEOUS) ×1 IMPLANT
KIT BASIN OR (CUSTOM PROCEDURE TRAY) ×1 IMPLANT
KIT IMAGING PINPOINTPAQ (MISCELLANEOUS) IMPLANT
KIT TURNOVER KIT A (KITS) ×1 IMPLANT
NEEDLE INSUFFLATION 14GA 120MM (NEEDLE) ×1 IMPLANT
PENCIL SMOKE EVACUATOR (MISCELLANEOUS) IMPLANT
POUCH RETRIEVAL ECOSAC 10 (ENDOMECHANICALS) IMPLANT
SCISSORS LAP 5X35 DISP (ENDOMECHANICALS) ×1 IMPLANT
SET CHOLANGIOGRAPH MIX (MISCELLANEOUS) IMPLANT
SET TUBE SMOKE EVAC HIGH FLOW (TUBING) ×1 IMPLANT
SLEEVE Z-THREAD 5X100MM (TROCAR) ×1 IMPLANT
SPIKE FLUID TRANSFER (MISCELLANEOUS) ×1 IMPLANT
SUT MNCRL AB 4-0 PS2 18 (SUTURE) ×1 IMPLANT
SUT VICRYL 0 UR6 27IN ABS (SUTURE) ×1 IMPLANT
TOWEL OR DSP ST BLU DLX 10/PK (DISPOSABLE) ×1 IMPLANT
TRAY LAPAROSCOPIC (CUSTOM PROCEDURE TRAY) ×1 IMPLANT
TROCAR 11X100 Z THREAD (TROCAR) ×1 IMPLANT
TROCAR Z-THREAD OPTICAL 5X100M (TROCAR) ×1 IMPLANT

## 2024-09-13 NOTE — Anesthesia Procedure Notes (Signed)
 Procedure Name: Intubation Date/Time: 09/13/2024 9:36 AM  Performed by: Delores Duwaine SAUNDERS, CRNAPre-anesthesia Checklist: Patient identified, Emergency Drugs available, Suction available and Patient being monitored Patient Re-evaluated:Patient Re-evaluated prior to induction Oxygen Delivery Method: Circle System Utilized Preoxygenation: Pre-oxygenation with 100% oxygen Induction Type: IV induction Ventilation: Mask ventilation without difficulty Laryngoscope Size: Mac and 3 Grade View: Grade I Tube type: Oral Tube size: 7.0 mm Number of attempts: 1 Airway Equipment and Method: Stylet and Oral airway Placement Confirmation: ETT inserted through vocal cords under direct vision, positive ETCO2 and breath sounds checked- equal and bilateral Secured at: 21 cm Tube secured with: Tape Dental Injury: Teeth and Oropharynx as per pre-operative assessment

## 2024-09-13 NOTE — Anesthesia Preprocedure Evaluation (Addendum)
"                                    Anesthesia Evaluation  Patient identified by MRN, date of birth, ID band Patient awake    Reviewed: Allergy & Precautions, NPO status , Patient's Chart, lab work & pertinent test results  History of Anesthesia Complications Negative for: history of anesthetic complications  Airway Mallampati: II  TM Distance: >3 FB Neck ROM: Full    Dental  (+) Dental Advisory Given, Teeth Intact   Pulmonary Current Smoker and Patient abstained from smoking.   Pulmonary exam normal        Cardiovascular negative cardio ROS Normal cardiovascular exam     Neuro/Psych  Headaches  negative psych ROS   GI/Hepatic Neg liver ROS,GERD  Controlled,,  Endo/Other  negative endocrine ROS    Renal/GU negative Renal ROS     Musculoskeletal negative musculoskeletal ROS (+)    Abdominal   Peds  Hematology  (+) Blood dyscrasia, anemia  Hgb E-beta thalassemia    Anesthesia Other Findings   Reproductive/Obstetrics                              Anesthesia Physical Anesthesia Plan  ASA: 2  Anesthesia Plan: General   Post-op Pain Management: Ofirmev  IV (intra-op)* and Toradol IV (intra-op)*   Induction: Intravenous  PONV Risk Score and Plan: 2 and Treatment may vary due to age or medical condition, Ondansetron , Dexamethasone and Midazolam  Airway Management Planned: Oral ETT  Additional Equipment: None  Intra-op Plan:   Post-operative Plan: Extubation in OR  Informed Consent: I have reviewed the patients History and Physical, chart, labs and discussed the procedure including the risks, benefits and alternatives for the proposed anesthesia with the patient or authorized representative who has indicated his/her understanding and acceptance.     Dental advisory given  Plan Discussed with: CRNA and Anesthesiologist  Anesthesia Plan Comments:          Anesthesia Quick Evaluation  "

## 2024-09-13 NOTE — ED Notes (Signed)
 Care and report to [facility].

## 2024-09-13 NOTE — Anesthesia Postprocedure Evaluation (Signed)
"   Anesthesia Post Note  Patient: [employer]  Procedure(s) Performed: LAPAROSCOPIC CHOLECYSTECTOMY (Abdomen)     Patient location during evaluation: PACU Anesthesia Type: General Level of consciousness: awake and alert Pain management: pain level controlled Vital Signs Assessment: post-procedure vital signs reviewed and stable Respiratory status: spontaneous breathing, nonlabored ventilation and respiratory function stable Cardiovascular status: stable and blood pressure returned to baseline Anesthetic complications: no   No notable events documented.  Last Vitals:  Vitals:   09/13/24 1153 09/13/24 1208  BP:  111/63  Pulse: 82 80  Resp: 18 18  Temp: 36.7 C 37.4 C  SpO2: 91% 97%    Last Pain:  Vitals:   09/13/24 1245  TempSrc:   PainSc: 6                  Debby FORBES Like      "

## 2024-09-13 NOTE — Plan of Care (Signed)
  Problem: Activity: Goal: Risk for activity intolerance will decrease Outcome: Progressing   Problem: Elimination: Goal: Will not experience complications related to bowel motility Outcome: Progressing   Problem: Pain Managment: Goal: General experience of comfort will improve and/or be controlled Outcome: Progressing

## 2024-09-13 NOTE — H&P (Signed)
 Shari Murray is an 39 y.o. female.   Chief Complaint: ab pain HPI: 34 yof with ruq pain and bloating for past few days.  Never had this before. Getting worse. Nothing she was doing at home helping.  No fevers.  Having bowel function. She was evaluated at outside ER and found to have normal lfts and lipase. WBC of 27.9.  CT scan shows diffuse gbw thickening with pericholecystic edema and gallstones.  She is transferred here for evaluation. 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 She started smoking about 30 years ago. She has a 23.1 pack-year smoking history. She has never used smokeless tobacco. She reports that she does not drink alcohol and does not use drugs.  Allergies: Allergies[1]  Facility-Administered Medications Prior to Admission  Medication Dose Route Frequency Provider Last Rate Last Admin   acetaminophen  (TYLENOL ) tablet 975 mg  975 mg Oral Once Copland, Jessica C, MD       Medications Prior to Admission  Medication Sig Dispense Refill   Iron-FA-B Cmp-C-Biot-Probiotic (FUSION PLUS) CAPS Take 1 capsule by mouth daily. 30 capsule 11    Results for orders placed or performed during the hospital encounter of 09/12/24 (from the past 48 hours)  Lipase, blood     Status: None   Collection Time: 09/12/24  1:08 PM  Result Value Ref Range   Lipase 22 11 - 51 U/L    Comment: Performed at [facility], [address], [place], [place] [address]  Comprehensive metabolic panel     Status: Abnormal   Collection Time: 09/12/24  1:08 PM  Result Value Ref Range   Sodium 137 135 - 145 mmol/L     Comment: Electrolytes repeated to verify    Potassium 3.5 3.5 - 5.1 mmol/L   Chloride 98 98 - 111 mmol/L   CO2 24 22 - 32 mmol/L   Glucose, Bld 151 (H) 70 - 99 mg/dL    Comment: Glucose reference range applies only to samples taken after fasting for at least 8 hours.   BUN <5 (L) 6 - 20 mg/dL   Creatinine, Ser 9.42 0.44 - 1.00 mg/dL   Calcium 9.3 8.9 - 89.6 mg/dL   Total Protein 7.9 6.5 - 8.1 g/dL   Albumin 4.5 3.5 - 5.0 g/dL   AST 29 15 - 41 U/L   ALT 24 0 - 44 U/L   Alkaline Phosphatase 91 38 - 126 U/L   Total Bilirubin 0.9 0.0 - 1.2 mg/dL   GFR, Estimated >39 >39 mL/min    Comment: (NOTE) Calculated using the CKD-EPI Creatinine Equation (2021)    Anion gap 16 (H) 5 - 15    Comment: Performed at [facility], [address] [place], [place] [address]  CBC     Status: Abnormal   Collection Time: 09/12/24  1:08 PM  Result Value Ref Range   WBC 27.9 (H) 4.0 - 10.5 K/uL   RBC 5.92 (H) 3.87 - 5.11 MIL/uL   Hemoglobin 11.6 (L) 12.0 - 15.0 g/dL  HCT 34.6 (L) 36.0 - 46.0 %   MCV 58.4 (L) 80.0 - 100.0 fL   MCH 19.6 (L) 26.0 - 34.0 pg   MCHC 33.5 30.0 - 36.0 g/dL   RDW 82.5 (H) 88.4 - 84.4 %   Platelets 450 (H) 150 - 400 K/uL   nRBC 0.0 0.0 - 0.2 %    Comment: Performed at [facility], [address] [place], [place] [address]  Differential     Status: Abnormal   Collection Time: 09/12/24  1:08 PM  Result Value Ref Range   Neutrophils Relative % 81 %   Neutro Abs 23.0 (H) 1.7 - 7.7 K/uL   Lymphocytes Relative 10 %   Lymphs Abs 2.7 0.7 - 4.0 K/uL   Monocytes Relative 6 %   Monocytes Absolute 1.8 (H) 0.1 - 1.0 K/uL   Eosinophils Relative 1 %   Eosinophils Absolute 0.2 0.0 - 0.5 K/uL   Basophils Relative 1 %   Basophils Absolute 0.1 0.0 - 0.1 K/uL   Immature Granulocytes 1 %   Abs Immature Granulocytes 0.21 (H) 0.00 - 0.07 K/uL    Comment: Performed at [facility], [address], [place], [place] [address]   Urinalysis, Routine w reflex microscopic -Urine, Clean Catch     Status: Abnormal   Collection Time: 09/12/24  2:20 PM  Result Value Ref Range   Color, Urine YELLOW YELLOW   APPearance CLEAR CLEAR   Specific Gravity, Urine 1.010 1.005 - 1.030   pH 6.0 5.0 - 8.0   Glucose, UA NEGATIVE NEGATIVE mg/dL   Hgb urine dipstick SMALL (A) NEGATIVE   Bilirubin Urine NEGATIVE NEGATIVE   Ketones, ur NEGATIVE NEGATIVE mg/dL   Protein, ur NEGATIVE NEGATIVE mg/dL   Nitrite NEGATIVE NEGATIVE   Leukocytes,Ua NEGATIVE NEGATIVE   RBC / HPF 0-5 0 - 5 RBC/hpf   WBC, UA 0-5 0 - 5 WBC/hpf   Bacteria, UA RARE (A) NONE SEEN   Squamous Epithelial / HPF 0-5 0 - 5 /HPF   Mucus PRESENT     Comment: Performed at [facility], [address] [place], [place] [address]  Pregnancy, urine     Status: None   Collection Time: 09/12/24  2:20 PM  Result Value Ref Range   Preg Test, Ur NEGATIVE NEGATIVE    Comment:        THE SENSITIVITY OF THIS METHODOLOGY IS >20 mIU/mL. Performed at [facility], [address] [place], [place] [address]   Lactic acid, plasma     Status: None   Collection Time: 09/12/24  2:55 PM  Result Value Ref Range   Lactic Acid, Venous 1.7 0.5 - 1.9 mmol/L    Comment: Performed at [facility], [address] [place], [place] [address]  Blood culture (routine x 2)     Status: None (Preliminary result)   Collection Time: 09/12/24  2:55 PM   Specimen: BLOOD RIGHT ARM  Result Value Ref Range   Specimen Description      BLOOD RIGHT ARM Performed at [facility], [address], [place], [place] [address]    Special Requests      BOTTLES DRAWN AEROBIC AND ANAEROBIC Blood Culture results may not be optimal due to an excessive volume of blood received in culture bottles Performed at [facility], [address], [place], [place] [address]    Culture      NO GROWTH < 12 HOURS Performed at [facility], [address] [place], [place] [address]    Report Status PENDING   Blood culture (routine x 2)     Status: None (Preliminary result)   Collection Time: 09/12/24  3:01 PM   Specimen: BLOOD LEFT ARM  Result Value Ref Range   Specimen Description      BLOOD LEFT ARM Performed at [facility], [address] [place], [place] [address]    Special Requests      BOTTLES DRAWN AEROBIC AND ANAEROBIC Blood Culture results may not be optimal due to an excessive volume of blood received in culture bottles Performed at [facility], [address] [place], [place] [address]    Culture      NO GROWTH < 12 HOURS Performed at [facility], [address] [place], [place] [address]    Report Status PENDING    CT ABDOMEN PELVIS W CONTRAST Result Date: 09/12/2024 EXAM: CT ABDOMEN AND PELVIS WITH CONTRAST 09/12/2024 04:07:19 PM TECHNIQUE: CT of the abdomen and pelvis was performed with the administration of 100 mL of iohexol (OMNIPAQUE) 300 MG/ML solution. Multiplanar reformatted images are provided for review. Automated exposure control, iterative reconstruction, and/or weight-based adjustment of the mA/kV was utilized to reduce the radiation dose to as low as reasonably achievable. COMPARISON: None available. CLINICAL HISTORY: Abdominal pain, acute, nonlocalized; more RUQ tenderness to palpation. FINDINGS: LOWER CHEST: No acute abnormality. LIVER: The liver is unremarkable. GALLBLADDER AND BILE DUCTS: Diffuse gallbladder wall thickening and edema. Several stones noted within the gallbladder neck with an impacted stone. There is a fused pericholecystic edema. No biliary ductal dilatation. SPLEEN: No acute abnormality. PANCREAS: No acute abnormality. ADRENAL GLANDS: No acute abnormality. KIDNEYS, URETERS AND BLADDER: No stones in the kidneys or ureters. No hydronephrosis. No perinephric or periureteral stranding. Urinary bladder is unremarkable. GI AND BOWEL: Stomach  demonstrates no acute abnormality. There is no bowel obstruction. PERITONEUM AND RETROPERITONEUM: Trace free fluid in the pelvis. No free air. VASCULATURE: Aorta is normal in caliber. LYMPH NODES: No lymphadenopathy. REPRODUCTIVE ORGANS: No acute abnormality. BONES AND SOFT TISSUES: No acute osseous abnormality. No focal soft tissue abnormality. IMPRESSION: 1. Cholelithiasis with findings most consistent with Acute cholecystitis. Clinical correlation and further evaluation with ultrasound recommended. Electronically signed by: Vanetta Chou MD 09/12/2024 05:08 PM EST RP Workstation: [id number]    Review of Systems  All other systems reviewed and are negative.   Blood pressure 109/72, pulse 91, temperature 98.8 F (37.1 C), temperature source Oral, resp. rate 18, height 4' 9 (1.448 m), weight 59 kg, last menstrual period 09/06/2024, SpO2 95%. Physical Exam Vitals reviewed.  Constitutional:      Appearance: She is well-developed.  Eyes:     General: No scleral icterus.    Pupils: Pupils are equal, round, and reactive to light.  Cardiovascular:     Rate and Rhythm: Normal rate and regular rhythm.  Pulmonary:     Effort: Pulmonary effort is normal.  Abdominal:     General: There is no distension.     Palpations: Abdomen is soft.     Tenderness: There is abdominal tenderness in the right upper quadrant.  Skin:    General: Skin is warm and dry.     Capillary Refill: Capillary refill takes less than 2 seconds.  Neurological:     General: No focal deficit present.     Mental Status: She is alert.      Assessment/Plan Cholecystitis --lap chole today -I discussed the procedure in  detail.  We discussed the risks and benefits of a laparoscopic cholecystectomy and possible cholangiogram including, but not limited to bleeding, infection, injury to surrounding structures such as the intestine or liver, bile leak, retained gallstones, need to convert to an open procedure, prolonged diarrhea,  blood clots such as  DVT, common bile duct injury, anesthesia risks, and possible need for additional procedures. We also discussed small risk of subtotal cholecystectomy.  The likelihood of improvement in symptoms and return to the patient's normal status is good. We discussed the typical post-operative recovery course.   Donnice Bury, MD 09/13/2024, 8:36 AM       [1]  Allergies Allergen Reactions   Shellfish Allergy Hives and Itching   Tramadol  Itching and Swelling

## 2024-09-13 NOTE — Op Note (Signed)
 Preoperative diagnosis: Acute cholecystitis Postoperative diagnosis: Gangrenous cholecystitis Procedure: Laparoscopic cholecystectomy Surgeon: Dr. Adina Bury Anesthesia: General Estimated blood loss: Less than 100 cc Complications: None Drains: 19 French Blake drain to right upper quadrant Specimens: Gallbladder and contents to pathology Sponge needle count was correct completion Disposition recovery stable condition  Indications: This is a 39 year old otherwise fairly healthy female who presents with right upper quadrant pain x 4 days.  She had a CT scan consistent with cholecystitis as well as an exam consistent with cholecystitis.  Her white blood cell count was 27.9.  I discussed going to the operating room for cholecystectomy.  Procedure: After informed consent was obtained she was taken to the operating room.  She was already given antibiotics.  SCDs were in place.  She was placed under general anesthesia without complication.  She was prepped and draped in a standard sterile surgical fashion.  A surgical timeout was then performed.  I infiltrated Marcaine  below the umbilicus.  I then made a vertical incision.  I grasped the fascia and incised this sharply.  I entered the peritoneum bluntly.  I then placed a pursestring suture with 0 Vicryl.  I placed a Hassan trocar and insufflated the abdomen to 15 mmHg pressure.  I then inserted 3 additional 5 mm trocars in the epigastrium and right upper quadrant without injury.  The gallbladder was encased in the omentum.  I was able to peel the omentum off.  She had gangrenous cholecystitis.  I had to aspirate the gallbladder just to be able to grasp it.  It was very thickened and there were several large stones present.  Eventually I was able to retract it cephalad.  With some difficulty using both sharp and blunt dissection I was able to remove the rind that was around it and identified the gallbladder.  I also used scissors to cut some adhesions  that took the duodenum away from the gallbladder which was very adherent.  I then was able to dissect in the triangle.  I did use ICG dye and I was able to identify the common duct.  The gallbladder went right down onto the common duct with very little cystic duct present.  I was able to identify the artery and I clipped and divided this leaving 2 clips in place.  I then elected to take the gallbladder top-down with cautery.  I took this top-down all the way down to the base.  I used the green dye to again identify the common duct which was right off of the actual gallbladder itself.  I elected to place 2 Endoloops as low down on the gallbladder without encompassing the common duct as I could.  I then divided the gallbladder.  I had spilled a couple of large stones during the dissection as the gallbladder fell apart.  I placed the gallbladder as well as the 2 stones in the retrieval bag and eventually remove these from the abdomen.  I obtained hemostasis.  I did place 2 pieces of Surgicel snow in the gallbladder fossa.  I also placed a 33 French Blake drain through the right sided incision and secured this with a 2-0 nylon and placed it in the right upper quadrant.  I am concerned as her gallbladder was distended that there may be leakage despite 2 Endoloops.  I then removed the [facility] trocar and tied my pursestring down.  I placed an additional 0 Vicryl suture with the suture passer device to completely obliterate that defect.  I then  desufflated the abdomen and remove the remaining trocars.  These were closed with 4-0 Monocryl and glue.  She tolerated this well was extubated and transferred to recovery stable.

## 2024-09-13 NOTE — Transfer of Care (Signed)
 Immediate Anesthesia Transfer of Care Note  Patient: Shari Murray  Procedure(s) Performed: LAPAROSCOPIC CHOLECYSTECTOMY (Abdomen)  Patient Location: PACU  Anesthesia Type:General  Level of Consciousness: sedated  Airway & Oxygen Therapy: Patient Spontanous Breathing  Post-op Assessment: Report given to RN  Post vital signs: Reviewed and stable  Last Vitals:  Vitals Value Taken Time  BP 91/53 09/13/24 11:17  Temp    Pulse 81 09/13/24 11:21  Resp 22 09/13/24 11:21  SpO2 98 % 09/13/24 11:21  Vitals shown include unfiled device data.  Last Pain:  Vitals:   09/13/24 0841  TempSrc: Oral  PainSc: 4          Complications: No notable events documented.

## 2024-09-14 ENCOUNTER — Encounter (HOSPITAL_COMMUNITY): Payer: Self-pay | Admitting: General Surgery

## 2024-09-14 LAB — COMPREHENSIVE METABOLIC PANEL WITH GFR
ALT: 48 U/L — ABNORMAL HIGH (ref 0–44)
AST: 38 U/L (ref 15–41)
Albumin: 3.4 g/dL — ABNORMAL LOW (ref 3.5–5.0)
Alkaline Phosphatase: 77 U/L (ref 38–126)
Anion gap: 10 (ref 5–15)
BUN: <5 mg/dL — ABNORMAL LOW (ref 6–20)
CO2: 25 mmol/L (ref 22–32)
Calcium: 8.7 mg/dL — ABNORMAL LOW (ref 8.9–10.3)
Chloride: 106 mmol/L (ref 98–111)
Creatinine, Ser: 0.58 mg/dL (ref 0.44–1.00)
GFR, Estimated: >60 mL/min
Glucose, Bld: 149 mg/dL — ABNORMAL HIGH (ref 70–99)
Potassium: 3.3 mmol/L — ABNORMAL LOW (ref 3.5–5.1)
Sodium: 140 mmol/L (ref 135–145)
Total Bilirubin: 0.5 mg/dL (ref 0.0–1.2)
Total Protein: 6.4 g/dL — ABNORMAL LOW (ref 6.5–8.1)

## 2024-09-14 LAB — CBC
HCT: 26.1 % — ABNORMAL LOW (ref 36.0–46.0)
HCT: 24.3 % — ABNORMAL LOW (ref 36.0–46.0)
Hemoglobin: 8.8 g/dL — ABNORMAL LOW (ref 12.0–15.0)
Hemoglobin: 8.2 g/dL — ABNORMAL LOW (ref 12.0–15.0)
MCH: 19.8 pg — ABNORMAL LOW (ref 26.0–34.0)
MCH: 19.7 pg — ABNORMAL LOW (ref 26.0–34.0)
MCHC: 33.7 g/dL (ref 30.0–36.0)
MCHC: 33.7 g/dL (ref 30.0–36.0)
MCV: 58.8 fL — ABNORMAL LOW (ref 80.0–100.0)
MCV: 58.4 fL — ABNORMAL LOW (ref 80.0–100.0)
Platelets: 410 K/uL — ABNORMAL HIGH (ref 150–400)
Platelets: 399 K/uL (ref 150–400)
RBC: 4.44 MIL/uL (ref 3.87–5.11)
RBC: 4.16 MIL/uL (ref 3.87–5.11)
RDW: 15.9 % — ABNORMAL HIGH (ref 11.5–15.5)
RDW: 15.8 % — ABNORMAL HIGH (ref 11.5–15.5)
WBC: 29.6 K/uL — ABNORMAL HIGH (ref 4.0–10.5)
WBC: 26.7 K/uL — ABNORMAL HIGH (ref 4.0–10.5)
nRBC: 0 % (ref 0.0–0.2)
nRBC: 0 % (ref 0.0–0.2)

## 2024-09-14 MED ORDER — POTASSIUM CHLORIDE CRYS ER 20 MEQ PO TBCR
40.0000 meq | EXTENDED_RELEASE_TABLET | Freq: Once | ORAL | Status: AC
  Administered 2024-09-14: 40 meq via ORAL
  Filled 2024-09-14: qty 2

## 2024-09-14 NOTE — Progress Notes (Signed)
 Patient ID: Shari Murray, female   DOB: Jan 20, 1986, 39 y.o.   MRN: [id number]   Acute Care Surgery Service Progress Note:    Chief Complaint/Subjective: Doing ok Pain ok Walked in room and hall - no dizziness Tolerating diet  Objective: Vital signs in last 24 hours: Temp:  [98 F (36.7 C)-99.2 F (37.3 C)] 98.4 F (36.9 C) (01/02 0927) Pulse Rate:  [68-88] 74 (01/02 0927) Resp:  [14-18] 18 (01/02 0927) BP: (88-121)/(60-84) 99/66 (01/02 0927) SpO2:  [94 %-100 %] 100 % (01/02 0927) Last BM Date : 09/12/24  Intake/Output from previous day: 01/01 0701 - 01/02 0700 In: 1098.6 [I.V.:507.2; IV Piggyback:591.4] Out: 1560 [Urine:1500; Drains:50; Blood:10] Intake/Output this shift: Total I/O In: -  Out: 920 [Urine:900; Drains:20]  Lungs: nonlabored  Cardiovascular: reg  Abd: soft, expected mild TTP, incisions ok, JP - old blood  Extremities: no edema, +SCDs  Neuro: alert, nonfocal  Lab Results: CBC  Recent Labs    09/14/24 0507 09/14/24 1153  WBC 29.6* 26.7*  HGB 8.8* 8.2*  HCT 26.1* 24.3*  PLT 410* 399   BMET Recent Labs    09/12/24 1308 09/14/24 0507  NA 137 140  K 3.5 3.3*  CL 98 106  CO2 24 25  GLUCOSE 151* 149*  BUN <5* <5*  CREATININE 0.57 0.58  CALCIUM 9.3 8.7*   LFT    Latest Ref Rng & Units 09/14/2024    5:07 AM 09/12/2024    1:08 PM 04/15/2021    8:44 AM  Hepatic Function  Total Protein 6.5 - 8.1 g/dL 6.4  7.9  6.9   Albumin 3.5 - 5.0 g/dL 3.4  4.5  4.3   AST 15 - 41 U/L 38  29  18   ALT 0 - 44 U/L 48  24  21   Alk Phosphatase 38 - 126 U/L 77  91  64   Total Bilirubin 0.0 - 1.2 mg/dL 0.5  0.9  0.4    PT/INR No results for input(s): LABPROT, INR in the last 72 hours. ABG No results for input(s): PHART, HCO3 in the last 72 hours.  Invalid input(s): PCO2, PO2  Studies/Results:  Anti-infectives: Anti-infectives (From admission, onward)    Start     Dose/Rate Route Frequency Ordered Stop   09/13/24 1630   metroNIDAZOLE (FLAGYL) IVPB 500 mg        500 mg 100 mL/hr over 60 Minutes Intravenous Every 12 hours 09/13/24 1201 09/14/24 0750   09/13/24 1530  ceFEPIme (MAXIPIME) 2 g in sodium chloride  0.9 % 100 mL IVPB        2 g 200 mL/hr over 30 Minutes Intravenous Every 8 hours 09/13/24 1201 09/14/24 1135   09/13/24 0430  metroNIDAZOLE (FLAGYL) IVPB 500 mg  Status:  Discontinued        500 mg 100 mL/hr over 60 Minutes Intravenous Every 12 hours 09/12/24 1722 09/13/24 1201   09/12/24 1530  metroNIDAZOLE (FLAGYL) IVPB 500 mg        500 mg 100 mL/hr over 60 Minutes Intravenous  Once 09/12/24 1517 09/12/24 1729   09/12/24 1530  ceFEPIme (MAXIPIME) 2 g in sodium chloride  0.9 % 100 mL IVPB  Status:  Discontinued        2 g 200 mL/hr over 30 Minutes Intravenous Every 8 hours 09/12/24 1517 09/13/24 1201       Medications: Scheduled Meds:  acetaminophen   1,000 mg Oral Q6H   enoxaparin (LOVENOX) injection  40 mg Subcutaneous Q24H   Continuous  Infusions: PRN Meds:.methocarbamol **OR** methocarbamol (ROBAXIN) injection, morphine injection, ondansetron  **OR** ondansetron  (ZOFRAN ) IV, oxyCODONE , simethicone   Assessment/Plan: Patient Active Problem List   Diagnosis Date Noted   S/P laparoscopic cholecystectomy 09/13/2024   Pregnancy 11/28/2017   SVD (spontaneous vaginal delivery) 11/28/2017   IDA (iron deficiency anemia) 09/27/2017   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   Hypokalemia - replace potassium  Disposition: repeat  labs in am, if hgb stable plan dc Saturday  LOS: 1 day    Shari Murray M. Tanda, MD, FACS General, Bariatric, & Minimally Invasive Surgery [phone] [facility], [facility]

## 2024-09-14 NOTE — Plan of Care (Signed)
" °  Problem: Education: Goal: Knowledge of the prescribed therapeutic regimen will improve 09/14/2024 0928 by Alaina Dozier PARAS, RN Outcome: Adequate for Discharge 09/14/2024 0749 by Alaina Dozier PARAS, RN Outcome: Progressing   Problem: Bowel/Gastric: Goal: Gastrointestinal status for postoperative course will improve 09/14/2024 0928 by Alaina Dozier PARAS, RN Outcome: Adequate for Discharge 09/14/2024 0749 by Alaina Dozier PARAS, RN Outcome: Progressing   Problem: Cardiac: Goal: Ability to maintain an adequate cardiac output 09/14/2024 0928 by Alaina Dozier PARAS, RN Outcome: Adequate for Discharge 09/14/2024 0749 by Alaina Dozier PARAS, RN Outcome: Progressing Goal: Will show no evidence of cardiac arrhythmias 09/14/2024 0928 by Alaina Dozier PARAS, RN Outcome: Adequate for Discharge 09/14/2024 0749 by Alaina Dozier PARAS, RN Outcome: Progressing   Problem: Nutritional: Goal: Will attain and maintain optimal nutritional status Outcome: Adequate for Discharge   Problem: Neurological: Goal: Will regain or maintain usual level of consciousness Outcome: Adequate for Discharge   Problem: Clinical Measurements: Goal: Ability to maintain clinical measurements within normal limits Outcome: Adequate for Discharge Goal: Postoperative complications will be avoided or minimized Outcome: Adequate for Discharge   Problem: Respiratory: Goal: Will regain and/or maintain adequate ventilation Outcome: Adequate for Discharge Goal: Respiratory status will improve Outcome: Adequate for Discharge   Problem: Skin Integrity: Goal: Demonstrates signs of wound healing without infection Outcome: Adequate for Discharge   Problem: Urinary Elimination: Goal: Will remain free from infection Outcome: Adequate for Discharge Goal: Ability to achieve and maintain adequate urine output Outcome: Adequate for Discharge   Problem: Education: Goal: Knowledge of General Education information will  improve Description: Including pain rating scale, medication(s)/side effects and non-pharmacologic comfort measures Outcome: Adequate for Discharge   Problem: Health Behavior/Discharge Planning: Goal: Ability to manage health-related needs will improve Outcome: Adequate for Discharge   Problem: Clinical Measurements: Goal: Ability to maintain clinical measurements within normal limits will improve Outcome: Adequate for Discharge Goal: Will remain free from infection Outcome: Adequate for Discharge Goal: Diagnostic test results will improve Outcome: Adequate for Discharge Goal: Respiratory complications will improve Outcome: Adequate for Discharge Goal: Cardiovascular complication will be avoided Outcome: Adequate for Discharge   Problem: Activity: Goal: Risk for activity intolerance will decrease Outcome: Adequate for Discharge   Problem: Nutrition: Goal: Adequate nutrition will be maintained Outcome: Adequate for Discharge   Problem: Coping: Goal: Level of anxiety will decrease Outcome: Adequate for Discharge   Problem: Elimination: Goal: Will not experience complications related to bowel motility Outcome: Adequate for Discharge Goal: Will not experience complications related to urinary retention Outcome: Adequate for Discharge   Problem: Pain Managment: Goal: General experience of comfort will improve and/or be controlled Outcome: Adequate for Discharge   Problem: Safety: Goal: Ability to remain free from injury will improve Outcome: Adequate for Discharge   Problem: Skin Integrity: Goal: Risk for impaired skin integrity will decrease Outcome: Adequate for Discharge   "

## 2024-09-14 NOTE — Plan of Care (Signed)
" °  Problem: Education: Goal: Knowledge of the prescribed therapeutic regimen will improve Outcome: Progressing   Problem: Bowel/Gastric: Goal: Gastrointestinal status for postoperative course will improve Outcome: Progressing   Problem: Cardiac: Goal: Ability to maintain an adequate cardiac output Outcome: Progressing Goal: Will show no evidence of cardiac arrhythmias Outcome: Progressing   "

## 2024-09-15 LAB — COMPREHENSIVE METABOLIC PANEL WITH GFR
ALT: 34 U/L (ref 0–44)
AST: 24 U/L (ref 15–41)
Albumin: 3.3 g/dL — ABNORMAL LOW (ref 3.5–5.0)
Alkaline Phosphatase: 78 U/L (ref 38–126)
Anion gap: 10 (ref 5–15)
BUN: 9 mg/dL (ref 6–20)
CO2: 24 mmol/L (ref 22–32)
Calcium: 8.2 mg/dL — ABNORMAL LOW (ref 8.9–10.3)
Chloride: 107 mmol/L (ref 98–111)
Creatinine, Ser: 0.47 mg/dL (ref 0.44–1.00)
GFR, Estimated: >60 mL/min
Glucose, Bld: 85 mg/dL (ref 70–99)
Potassium: 3.4 mmol/L — ABNORMAL LOW (ref 3.5–5.1)
Sodium: 140 mmol/L (ref 135–145)
Total Bilirubin: 0.3 mg/dL (ref 0.0–1.2)
Total Protein: 6 g/dL — ABNORMAL LOW (ref 6.5–8.1)

## 2024-09-15 LAB — CBC
HCT: 24.6 % — ABNORMAL LOW (ref 36.0–46.0)
Hemoglobin: 8.4 g/dL — ABNORMAL LOW (ref 12.0–15.0)
MCH: 19.9 pg — ABNORMAL LOW (ref 26.0–34.0)
MCHC: 34.1 g/dL (ref 30.0–36.0)
MCV: 58.3 fL — ABNORMAL LOW (ref 80.0–100.0)
Platelets: 430 K/uL — ABNORMAL HIGH (ref 150–400)
RBC: 4.22 MIL/uL (ref 3.87–5.11)
RDW: 15.6 % — ABNORMAL HIGH (ref 11.5–15.5)
WBC: 17.8 K/uL — ABNORMAL HIGH (ref 4.0–10.5)
nRBC: 0.1 % (ref 0.0–0.2)

## 2024-09-15 MED ORDER — POTASSIUM CHLORIDE CRYS ER 20 MEQ PO TBCR
40.0000 meq | EXTENDED_RELEASE_TABLET | Freq: Once | ORAL | Status: AC
  Administered 2024-09-15: 40 meq via ORAL
  Filled 2024-09-15: qty 2

## 2024-09-15 MED ORDER — OXYCODONE HCL 5 MG PO TABS: 5.0000 mg | ORAL_TABLET | Freq: Four times a day (QID) | ORAL | 0 refills | Status: AC | PRN

## 2024-09-15 NOTE — Progress Notes (Signed)
 Reviewed written d/c instructions w pt and all questions answered. Pt instructed on care of JP drain and a wk of supplies given to pt, she verbalized understanding. D/C via w/c w all belongings in stable condition.

## 2024-09-15 NOTE — Discharge Instructions (Signed)
 " LAPAROSCOPIC SURGERY: POST OP INSTRUCTIONS Always review your discharge instruction sheet given to you by the facility where your surgery was performed. IF YOU HAVE DISABILITY OR FAMILY LEAVE FORMS, YOU MUST BRING THEM TO THE OFFICE FOR PROCESSING.   DO NOT GIVE THEM TO YOUR DOCTOR.  PAIN CONTROL  First take acetaminophen  (Tylenol ) AND/or ibuprofen  (Advil ) to control your pain after surgery.  Follow directions on package.  Taking acetaminophen  (Tylenol ) and/or ibuprofen  (Advil ) regularly after surgery will help to control your pain and lower the amount of prescription pain medication you may need.  You should not take more than 3,000 mg (3 grams) of acetaminophen  (Tylenol ) in 24 hours.  You should not take ibuprofen  (Advil ), aleve, motrin , naprosyn or other NSAIDS if you have a history of stomach ulcers or chronic kidney disease.  A prescription for pain medication may be given to you upon discharge.  Take your pain medication as prescribed, if you still have uncontrolled pain after taking acetaminophen  (Tylenol ) or ibuprofen  (Advil ). Use ice packs to help control pain. If you need a refill on your pain medication, please contact your pharmacy.  They will contact our office to request authorization. Prescriptions will not be filled after 5pm or on week-ends.  HOME MEDICATIONS Take your usually prescribed medications unless otherwise directed.  DIET You should follow a light diet the first few days after arrival home.  Be sure to include lots of fluids daily. Avoid fatty, fried foods.   CONSTIPATION It is common to experience some constipation after surgery and if you are taking pain medication.  Increasing fluid intake and taking a stool softener (such as Colace) will usually help or prevent this problem from occurring.  A mild laxative (Milk of Magnesia or Miralax) should be taken according to package instructions if there are no bowel movements after 48 hours.  WOUND/INCISION CARE Most  patients will experience some swelling and bruising in the area of the incisions.  Ice packs will help.  Swelling and bruising can take several days to resolve.  Unless discharge instructions indicate otherwise, follow guidelines below  STERI-STRIPS - you may remove your outer bandages 48 hours after surgery, and you may shower at that time.  You have steri-strips (small skin tapes) in place directly over the incision.  These strips should be left on the skin for 7-10 days.   DERMABOND/SKIN GLUE - you may shower in 24 hours.  The glue will flake off over the next 2-3 weeks. Any sutures or staples will be removed at the office during your follow-up visit.  ACTIVITIES You may resume regular (light) daily activities beginning the next day--such as daily self-care, walking, climbing stairs--gradually increasing activities as tolerated.  You may have sexual intercourse when it is comfortable.  Refrain from any heavy lifting or straining until approved by your doctor. You may drive when you are no longer taking prescription pain medication, you can comfortably wear a seatbelt, and you can safely maneuver your car and apply brakes.  FOLLOW-UP You should see your doctor in the office for a follow-up appointment approximately 2-3 weeks after your surgery.  You should have been given your post-op/follow-up appointment when your surgery was scheduled.  If you did not receive a post-op/follow-up appointment, make sure that you call for this appointment within a day or two after you arrive home to insure a convenient appointment time.  OTHER INSTRUCTIONS Record JP drain output  WHEN TO CALL YOUR DOCTOR: Fever over 101.0 Inability to urinate Continued bleeding  from incision. Increased pain, redness, or drainage from the incision. Increasing abdominal pain  The clinic staff is available to answer your questions during regular business hours.  Please dont hesitate to call and ask to speak to one of the  nurses for clinical concerns.  If you have a medical emergency, go to the nearest emergency room or call 911.  A surgeon from [facility] is always on call at the hospital. [address], [address], [place], [place]  [address] ? [address] [address], [place], [place]   [address] [phone] ? [phone] ? FAX [phone] Web site: [email or web address]  "

## 2024-09-15 NOTE — Discharge Summary (Signed)
 Physician Discharge Summary  Shari Murray FMW:[id number] DOB: 10/15/1985 DOA: 09/12/2024  PCP: Freddrick, No  Admit date: 09/12/2024 Discharge date: 09/15/2024  Recommendations for Outpatient Follow-up:     Follow-up Information     [facility]. Go on 09/21/2024.   Specialty: General Surgery Why: arrive at 8:30 AM for a DOW postop appt Contact information: [address] [place] [address] [phone]                Discharge Diagnoses:  Gangrenous cholecystitis  Surgical Procedure: Laparoscopic cholecystectomy-Dr. Ebbie, September 13, 2024  Discharge Condition: Good Disposition: Home with surgical drain  Diet recommendation: Regular  Filed Weights   09/12/24 1306 09/13/24 0841  Weight: 59 kg 59 kg    History of present illness:  This is a 39 year old otherwise fairly healthy female who presents with right upper quadrant pain x 4 days. She had a CT scan consistent with cholecystitis as well as an exam consistent with cholecystitis. Her white blood cell count was 27.9   Hospital Course:  The patient was started on IV antibiotics and taken to the operating room on January 1 with Dr. Ebbie.  At that time she was found to have a gangrenous gallbladder.  She was kept due to the severity of the inflammation as well as oozing that was encountered during surgery from the liver bed.  She was continued on IV antibiotics for 24 hours.  A surgical drain had been left to monitor for bile leak.  On postoperative day 1 she was doing well however her hemoglobin did drop.  It was decided to keep her for another day.  On postoperative day 2, her hemoglobin was stable.  She was tolerating a diet.  Her vital signs were stable.  Her drain just had serosanguineous fluid and it.  She had received discharge instructions on 2 separate occasions-on postop day 1 and again on postop day 2.  The nurse taught her drain management.  She was given a note for work.  All of  her questions were asked and answered and I deemed stable for discharge  BP 100/62 (BP Location: Right Arm)   Pulse 73   Temp 98.2 F (36.8 C) (Oral)   Resp 18   Ht 4' 9 (1.448 m)   Wt 59 kg   LMP 09/06/2024 (Approximate)   SpO2 98%   BMI 28.13 kg/m   Gen: alert, NAD, non-toxic appearing Pupils: equal, no scleral icterus Pulm:  symmetric chest rise CV: regular rate and rhythm Abd: soft, mild tender, nondistended.  No cellulitis. No incisional hernia; JP-serosanguineous Ext: no edema, no calf tenderness Skin: no rash, no jaundice    Discharge Instructions  Discharge Instructions     Call MD for:   Complete by: As directed    Temperature >101   Call MD for:  hives   Complete by: As directed    Call MD for:  persistant dizziness or light-headedness   Complete by: As directed    Call MD for:  persistant nausea and vomiting   Complete by: As directed    Call MD for:  redness, tenderness, or signs of infection (pain, swelling, redness, odor or green/yellow discharge around incision site)   Complete by: As directed    Call MD for:  severe uncontrolled pain   Complete by: As directed    Diet general   Complete by: As directed    Discharge instructions   Complete by: As directed    See  [facility] discharge instructions   Increase activity slowly   Complete by: As directed       Allergies as of 09/15/2024       Reactions   Shellfish Allergy Hives, Itching   Tramadol  Itching, Swelling        Medication List     STOP taking these medications    nitrofurantoin  100 MG capsule Commonly known as: MACRODANTIN        TAKE these medications    acetaminophen  500 MG tablet Commonly known as: TYLENOL  Take 500-1,000 mg by mouth daily as needed for mild pain (pain score 1-3) or moderate pain (pain score 4-6).   oxyCODONE  5 MG immediate release tablet Commonly known as: Oxy IR/ROXICODONE  Take 1 tablet (5 mg total) by mouth every 6 (six) hours as needed for severe pain  (pain score 7-10).        Follow-up Information     [facility]. Go on 09/21/2024.   Specialty: General Surgery Why: arrive at 8:30 AM for a DOW postop appt Contact information: [address] [place] [address] [phone]                  The results of significant diagnostics from this hospitalization (including imaging, microbiology, ancillary and laboratory) are listed below for reference.    Significant Diagnostic Studies: CT ABDOMEN PELVIS W CONTRAST Result Date: 09/12/2024 EXAM: CT ABDOMEN AND PELVIS WITH CONTRAST 09/12/2024 04:07:19 PM TECHNIQUE: CT of the abdomen and pelvis was performed with the administration of 100 mL of iohexol  (OMNIPAQUE ) 300 MG/ML solution. Multiplanar reformatted images are provided for review. Automated exposure control, iterative reconstruction, and/or weight-based adjustment of the mA/kV was utilized to reduce the radiation dose to as low as reasonably achievable. COMPARISON: None available. CLINICAL HISTORY: Abdominal pain, acute, nonlocalized; more RUQ tenderness to palpation. FINDINGS: LOWER CHEST: No acute abnormality. LIVER: The liver is unremarkable. GALLBLADDER AND BILE DUCTS: Diffuse gallbladder wall thickening and edema. Several stones noted within the gallbladder neck with an impacted stone. There is a fused pericholecystic edema. No biliary ductal dilatation. SPLEEN: No acute abnormality. PANCREAS: No acute abnormality. ADRENAL GLANDS: No acute abnormality. KIDNEYS, URETERS AND BLADDER: No stones in the kidneys or ureters. No hydronephrosis. No perinephric or periureteral stranding. Urinary bladder is unremarkable. GI AND BOWEL: Stomach demonstrates no acute abnormality. There is no bowel obstruction. PERITONEUM AND RETROPERITONEUM: Trace free fluid in the pelvis. No free air. VASCULATURE: Aorta is normal in caliber. LYMPH NODES: No lymphadenopathy. REPRODUCTIVE ORGANS: No acute abnormality. BONES AND SOFT  TISSUES: No acute osseous abnormality. No focal soft tissue abnormality. IMPRESSION: 1. Cholelithiasis with findings most consistent with Acute cholecystitis. Clinical correlation and further evaluation with ultrasound recommended. Electronically signed by: Vanetta Chou MD 09/12/2024 05:08 PM EST RP Workstation: [id number]    Microbiology: Recent Results (from the past 240 hours)  Blood culture (routine x 2)     Status: None (Preliminary result)   Collection Time: 09/12/24  2:55 PM   Specimen: BLOOD RIGHT ARM  Result Value Ref Range Status   Specimen Description   Final    BLOOD RIGHT ARM Performed at [facility], [address], [place], [place] [address]    Special Requests   Final    BOTTLES DRAWN AEROBIC AND ANAEROBIC Blood Culture results may not be optimal due to an excessive volume of blood received in culture bottles Performed at [facility], [address], [place], [place] [address]    Culture  Final    NO GROWTH 3 DAYS Performed at [facility], [address], [place], [place] [address]    Report Status PENDING  Incomplete  Blood culture (routine x 2)     Status: None (Preliminary result)   Collection Time: 09/12/24  3:01 PM   Specimen: BLOOD LEFT ARM  Result Value Ref Range Status   Specimen Description   Final    BLOOD LEFT ARM Performed at [facility], [address] [place], [place] [address]    Special Requests   Final    BOTTLES DRAWN AEROBIC AND ANAEROBIC Blood Culture results may not be optimal due to an excessive volume of blood received in culture bottles Performed at [facility], [address] [place], [place] [address]    Culture   Final    NO GROWTH 3 DAYS Performed at [facility], [address], [place], [place] [address]    Report Status PENDING  Incomplete     Labs: Basic Metabolic Panel: Recent Labs  Lab 09/12/24 1308 09/14/24 0507 09/15/24 0517  NA 137 140 140  K  3.5 3.3* 3.4*  CL 98 106 107  CO2 24 25 24   GLUCOSE 151* 149* 85  BUN <5* <5* 9  CREATININE 0.57 0.58 0.47  CALCIUM 9.3 8.7* 8.2*   Liver Function Tests: Recent Labs  Lab 09/12/24 1308 09/14/24 0507 09/15/24 0517  AST 29 38 24  ALT 24 48* 34  ALKPHOS 91 77 78  BILITOT 0.9 0.5 0.3  PROT 7.9 6.4* 6.0*  ALBUMIN 4.5 3.4* 3.3*   Recent Labs  Lab 09/12/24 1308  LIPASE 22   No results for input(s): AMMONIA in the last 168 hours. CBC: Recent Labs  Lab 09/12/24 1308 09/14/24 0507 09/14/24 1153 09/15/24 0517  WBC 27.9* 29.6* 26.7* 17.8*  NEUTROABS 23.0*  --   --   --   HGB 11.6* 8.8* 8.2* 8.4*  HCT 34.6* 26.1* 24.3* 24.6*  MCV 58.4* 58.8* 58.4* 58.3*  PLT 450* 410* 399 430*    Principal Problem:   S/P laparoscopic cholecystectomy   Time coordinating discharge: 20 min  Signed:  Camellia CHRISTELLA Blush, MD [facility] [phone] 09/15/2024, 10:16 AM

## 2024-09-17 LAB — CULTURE, BLOOD (ROUTINE X 2)
Culture: NO GROWTH
Culture: NO GROWTH

## 2024-09-17 LAB — SURGICAL PATHOLOGY

## 2024-09-21 ENCOUNTER — Encounter: Payer: Self-pay | Admitting: Family

## 2024-09-21 NOTE — Progress Notes (Signed)
 Pt presented today for removal of JP drain from right abdomen . Patient s/p lap chole  by Dr. RANCE on 09/13/2024 . No signs of infection. Incision was prepped with alcohol. Drain removed successfully, and pt tolerated well. Reinforced wound with 4x4 gauze and compression tape. Let pt know it is safe to shower and change dressing daily. Advised pt to look for any signs of infection: swelling, increased redness, odorous drainage, major pain, or fever/chills and to call with any concerns or future questions. Pt verbalized understanding.     Next appointment on 10/09/2024 at 11am with Dr. RANCE  for Post Op appointment.     HM

## 2024-09-24 ENCOUNTER — Encounter: Payer: Self-pay | Admitting: Family

## 2024-10-01 ENCOUNTER — Encounter: Payer: Self-pay | Admitting: Family

## 2024-10-03 ENCOUNTER — Encounter: Payer: Self-pay | Admitting: Family
